# Patient Record
Sex: Male | Born: 1970 | Race: Black or African American | Hispanic: No | Marital: Single | Smoking: Current every day smoker
Health system: Southern US, Community
[De-identification: ages and names within clinical notes are randomized; demographics above are authoritative.]

## PROBLEM LIST (undated history)

## (undated) HISTORY — PX: HAND SURGERY: SHX662

## (undated) HISTORY — PX: JOINT REPLACEMENT: SHX530

---

## 2020-03-27 ENCOUNTER — Emergency Department (HOSPITAL_COMMUNITY)
Admission: EM | Admit: 2020-03-27 | Discharge: 2020-03-27 | Disposition: A | Discharge status: Home / Self Care | Payer: Self-pay | Attending: Emergency Medicine | Admitting: Emergency Medicine

## 2020-03-27 ENCOUNTER — Emergency Department (HOSPITAL_COMMUNITY): Payer: Self-pay

## 2020-03-27 ENCOUNTER — Other Ambulatory Visit: Payer: Self-pay

## 2020-03-27 ENCOUNTER — Encounter (HOSPITAL_COMMUNITY): Payer: Self-pay | Admitting: Emergency Medicine

## 2020-03-27 DIAGNOSIS — M25562 Pain in left knee: Secondary | ICD-10-CM | POA: Insufficient documentation

## 2020-03-27 DIAGNOSIS — F17210 Nicotine dependence, cigarettes, uncomplicated: Secondary | ICD-10-CM | POA: Insufficient documentation

## 2020-03-27 LAB — BASIC METABOLIC PANEL
Anion gap: 11 (ref 5–15)
BUN: 6 mg/dL (ref 6–20)
CO2: 24 mmol/L (ref 22–32)
Calcium: 8.4 mg/dL — ABNORMAL LOW (ref 8.9–10.3)
Chloride: 104 mmol/L (ref 98–111)
Creatinine, Ser: 0.75 mg/dL (ref 0.61–1.24)
GFR calc Af Amer: >60 mL/min (ref >60)
GFR calc non Af Amer: >60 mL/min (ref >60)
Glucose, Bld: 90 mg/dL (ref 70–99)
Potassium: 3.7 mmol/L (ref 3.5–5.1)
Sodium: 139 mmol/L (ref 135–145)

## 2020-03-27 LAB — CBC WITH DIFFERENTIAL/PLATELET
Abs Immature Granulocytes: 0.01 10*3/uL (ref 0.00–0.07)
Basophils Absolute: 0 10*3/uL (ref 0.0–0.1)
Basophils Relative: 0 %
Eosinophils Absolute: 0.2 10*3/uL (ref 0.0–0.5)
Eosinophils Relative: 6 %
HCT: 44.3 % (ref 39.0–52.0)
Hemoglobin: 14.7 g/dL (ref 13.0–17.0)
Immature Granulocytes: 0 %
Lymphocytes Relative: 48 %
Lymphs Abs: 1.6 10*3/uL (ref 0.7–4.0)
MCH: 32.1 pg (ref 26.0–34.0)
MCHC: 33.2 g/dL (ref 30.0–36.0)
MCV: 96.7 fL (ref 80.0–100.0)
Monocytes Absolute: 0.4 10*3/uL (ref 0.1–1.0)
Monocytes Relative: 12 %
Neutro Abs: 1.2 10*3/uL — ABNORMAL LOW (ref 1.7–7.7)
Neutrophils Relative %: 34 %
Platelets: 135 10*3/uL — ABNORMAL LOW (ref 150–400)
RBC: 4.58 MIL/uL (ref 4.22–5.81)
RDW: 15.1 % (ref 11.5–15.5)
WBC: 3.5 10*3/uL — ABNORMAL LOW (ref 4.0–10.5)
nRBC: 0 % (ref 0.0–0.2)

## 2020-03-27 LAB — SEDIMENTATION RATE: Sed Rate: 8 mm/hr (ref 0–16)

## 2020-03-27 LAB — C-REACTIVE PROTEIN: CRP: 0.6 mg/dL (ref <1.0)

## 2020-03-27 MED ORDER — NAPROXEN 500 MG PO TABS
500.0000 mg | ORAL_TABLET | Freq: Two times a day (BID) | ORAL | 0 refills | Status: DC | PRN
Start: 2020-03-27 — End: 2021-10-18

## 2020-03-27 MED ORDER — METHOCARBAMOL 500 MG PO TABS
500.0000 mg | ORAL_TABLET | Freq: Three times a day (TID) | ORAL | 0 refills | Status: DC | PRN
Start: 2020-03-27 — End: 2021-10-18

## 2020-03-27 MED ORDER — HYDROCODONE-ACETAMINOPHEN 5-325 MG PO TABS
1.0000 | ORAL_TABLET | Freq: Once | ORAL | Status: AC
  Administered 2020-03-27: 1 via ORAL
  Filled 2020-03-27: qty 1

## 2020-03-27 MED ORDER — KETOROLAC TROMETHAMINE 60 MG/2ML IM SOLN
30.0000 mg | Freq: Once | INTRAMUSCULAR | Status: AC
  Administered 2020-03-27: 30 mg via INTRAMUSCULAR
  Filled 2020-03-27: qty 2

## 2020-03-27 MED ORDER — OXYCODONE-ACETAMINOPHEN 5-325 MG PO TABS
1.0000 | ORAL_TABLET | Freq: Once | ORAL | Status: AC
  Administered 2020-03-27: 1 via ORAL
  Filled 2020-03-27: qty 1

## 2020-03-27 NOTE — ED Provider Notes (Signed)
[facility]-EMERGENCY DEPT Provider Note   CSN: [id number] Arrival date & time: 03/27/20  6237     History Chief Complaint  Patient presents with  . Leg Pain    Jay Gross is a 49 y.o. male with a history of multiple left knee surgical procedures who presents to the emergency department with complaints of left knee pain that began when he got out of bed this evening.  Patient states that he stood up and felt that his left knee gave out causing him to fall.  He denies head injury or loss of consciousness.  He states he had discomfort since then that is worse with movement.  He has baseline swelling in the left knee that is mildly worsened since the knee giving out.  He denies any redness, fever, chills, numbness, tingling, or weakness.  His prior surgeries have been in [place].  HPI     History reviewed. No pertinent past medical history.  There are no problems to display for this patient.   History reviewed. No pertinent surgical history.     History reviewed. No pertinent family history.  Social History   Tobacco Use  . Smoking status: Current Every Day Smoker  . Smokeless tobacco: Never Used  Vaping Use  . Vaping Use: Never used  Substance Use Topics  . Alcohol use: Never  . Drug use: Never    Home Medications Prior to Admission medications   Not on File    Allergies    Patient has no known allergies.  Review of Systems   Review of Systems  Constitutional: Negative for chills and fever.  Respiratory: Negative for shortness of breath.   Cardiovascular: Negative for chest pain.  Gastrointestinal: Negative for abdominal pain.  Musculoskeletal: Positive for arthralgias and joint swelling.  Skin: Negative for color change, rash and wound.  Neurological: Negative for weakness and numbness.  All other systems reviewed and are negative.  Physical Exam Updated Vital Signs BP (!) 117/96   Pulse 87   Temp 98.2 F (36.8 C) (Oral)    Resp 18   Ht 6' (1.829 m)   Wt 81.6 kg   SpO2 98%   BMI 24.41 kg/m   Physical Exam Vitals and nursing note reviewed.  Constitutional:      General: He is not in acute distress.    Appearance: He is well-developed. He is not ill-appearing or toxic-appearing.  HENT:     Head: Normocephalic and atraumatic.  Eyes:     General:        Right eye: No discharge.        Left eye: No discharge.     Conjunctiva/sclera: Conjunctivae normal.  Cardiovascular:     Rate and Rhythm: Normal rate and regular rhythm.     Pulses:          Dorsalis pedis pulses are 2+ on the right side and 2+ on the left side.       Posterior tibial pulses are 2+ on the right side and 2+ on the left side.  Pulmonary:     Effort: Pulmonary effort is normal. No respiratory distress.     Breath sounds: Normal breath sounds. No wheezing, rhonchi or rales.  Abdominal:     General: There is no distension.     Palpations: Abdomen is soft.     Tenderness: There is no abdominal tenderness.  Musculoskeletal:     Cervical back: Neck supple.     Comments: Lower extremities: Patient has  previous surgical scars healed to the left lower extremity.  The left knee is swollen compared to the right knee with a questionable joint effusion, patient states that his left knee is more swollen than the right at baseline but it is mildly worse currently.  There is no erythema or warmth.  There are no significant open wounds.  Patient has intact active range of motion throughout the entire right lower extremity as well as all joints in the left lower extremity with the exception of the left knee.  He is able to almost fully extend the left knee, able to flex to about 90 degrees.  No significant pain with passive range of motion.  Patient is tender diffusely throughout the left knee.  Lower extremities are otherwise nontender.  Compartments are soft.  Skin:    General: Skin is warm and dry.     Capillary Refill: Capillary refill takes less than 2  seconds.     Findings: No rash.  Neurological:     Mental Status: He is alert.     Comments: Alert. Clear speech. Sensation grossly intact to bilateral lower extremities. 5/5 strength with knee flexion/extension and ankle plantar/dorsiflexion bilaterally.    Psychiatric:        Mood and Affect: Mood normal.        Behavior: Behavior normal.     ED Results / Procedures / Treatments   Labs (all labs ordered are listed, but only abnormal results are displayed) Labs Reviewed  CBC WITH DIFFERENTIAL/PLATELET - Abnormal; Notable for the following components:      Result Value   WBC 3.5 (*)    Platelets 135 (*)    Neutro Abs 1.2 (*)    All other components within normal limits  BASIC METABOLIC PANEL - Abnormal; Notable for the following components:   Calcium 8.4 (*)    All other components within normal limits  SEDIMENTATION RATE  C-REACTIVE PROTEIN    EKG None  Radiology DG Knee Complete 4 Views Left  Result Date: 03/27/2020 CLINICAL DATA:  Left leg pain, unable to bear weight or extend EXAM: LEFT KNEE - COMPLETE 4+ VIEW COMPARISON:  None. 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 Alignment is grossly preserved though in a moderately flexed knee position. Heterogeneous changes about the proximal tibia with some peripheral lucency along the lateral tibial and medial femoral condylar components. Possibly related to patient's history of prior revision however, cannot exclude hardware infection or septic arthritis particularly given some more circumferential edematous features and a small suspected effusion. No soft tissue gas or foreign body. IMPRESSION: 1. 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 2. Heterogeneous appearance of the proximal tibia with some peripheral lucency along the lateral tibial plateau and medial femoral condylar component. Feasibly related to prior revision however cannot exclude hardware infection or septic arthritis given  circumferential swelling and effusion. Correlate with clinical symptoms and history. 3. No definite acute fracture or traumatic osseous injury. Though evaluation is limited due to sub standard views. 4. Small effusion. Electronically Signed   By: Lovena Le M.D.   On: 03/27/2020 02:16    Procedures Procedures (including critical care time)  Medications Ordered in ED Medications  HYDROcodone-acetaminophen (NORCO/VICODIN) 5-325 MG per tablet 1 tablet (1 tablet Oral Given 03/27/20 0212)    ED Course  I have reviewed the triage vital signs and the nursing notes.  Pertinent labs & imaging results that were available during my care of the patient were reviewed by me and  considered in my medical decision making (see chart for details).    MDM Rules/Calculators/A&P                         Patient presents to the ED with complaints of left knee pain after it giving out when he tried to get out of bed. Nontoxic, vitals WNL On exam swelling and likely small effusion present, mild limitation in ROM, diffusely tender. No signs of infection.   Imaging Studies ordered:  I ordered imaging studies which included left knee x-ray, I independently visualized and interpreted imaging which showed 1. 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 2. Heterogeneous appearance of the proximal tibia with some peripheral lucency along the lateral tibial plateau and medial femoral condylar component. Feasibly related to prior revision however cannot exclude hardware infection or septic arthritis given circumferential swelling and effusion. Correlate with clinical symptoms and history. 3. No definite acute fracture or traumatic osseous injury. Though evaluation is limited due to sub standard views. 4. Small effusion.   Lab Tests:  I Ordered, reviewed, and interpreted labs, which included:  GBT:DVVO leukopenia, no leukocytosis.  BMP: mild hypocalcemia no significant electrolyte derangement.  Sed rate & CRP:  WNL  ED Course:  Given x-ray with likely postsurgical changes however could not exclude hardware infection or septic arthritis per radiologist interpretation labs were obtained, patient does not have a leukocytosis or elevated inflammatory markers, he is afebrile, there is no overlying erythema or warmth to the joint, therefore do not suspect septic joint at this time.  He is able to actively flex/extend the knee therefore do not suspect quadriceps/patellar tendon injury.  No calf tenderness or edema to raise concern for DVT.  Good symmetric pulses with symmetric temperature therefore do not suspect arterial emboli.  X-ray without fracture or dislocation.  Will place patient into brace, provide crutches, recommended PRICE & will treat with NSAIDs & muscle relxant with orthopedics follow up. I discussed results, treatment plan, need for follow-up, and return precautions with the patient. Provided opportunity for questions, patient confirmed understanding and is in agreement with plan.   Findings and plan of care discussed with supervising physician Dr. Elesa Massed who is in agreement.    Analgesics given in the emergency department with brace applied and crutches provided. Portions of this note were generated with Scientist, clinical (histocompatibility and immunogenetics). Dictation errors may occur despite best attempts at proofreading.  Final Clinical Impression(s) / ED Diagnoses Final diagnoses:  Acute pain of left knee    Rx / DC Orders ED Discharge Orders         Ordered    naproxen (NAPROSYN) 500 MG tablet  2 times daily PRN     Discontinue  Reprint     03/27/20 0503    methocarbamol (ROBAXIN) 500 MG tablet  Every 8 hours PRN     Discontinue  Reprint     03/27/20 0503           Kinesha Auten, Pleas Koch, PA-C 03/27/20 0506    Ward, Layla Maw, DO 03/27/20 1607

## 2020-03-27 NOTE — Discharge Instructions (Addendum)
Please read and follow all provided instructions.  You have been seen today for left knee pain.   Tests performed today include: An x-ray of the affected area - does NOT show any broken bones or dislocations.  Vital signs. See below for your results today.   Home care instructions: -- *PRICE in the first 24-48 hours after injury: Protect (with brace, splint, sling), if given by your provider Rest Ice- Do not apply ice pack directly to your skin, place towel or similar between your skin and ice/ice pack. Apply ice for 20 min, then remove for 40 min while awake Compression- Wear brace, elastic bandage, splint as directed by your provider Elevate affected extremity above the level of your heart when not walking around for the first 24-48 hours   Medications:  - Naproxen is a nonsteroidal anti-inflammatory medication that will help with pain and swelling. Be sure to take this medication as prescribed with food, 1 pill every 12 hours,  It should be taken with food, as it can cause stomach upset, and more seriously, stomach bleeding. Do not take other nonsteroidal anti-inflammatory medications with this such as Advil, Motrin, Aleve, Mobic, Goodie Powder, or Motrin.    - Robaxin is the muscle relaxer I have prescribed, this is meant to help with muscle tightness. Be aware that this medication may make you drowsy therefore the first time you take this it should be at a time you are in an environment where you can rest. Do not drive or operate heavy machinery when taking this medication. Do not drink alcohol or take other sedating medications with this medicine such as narcotics or benzodiazepines.   You make take Tylenol per over the counter dosing with these medications.   We have prescribed you new medication(s) today. Discuss the medications prescribed today with your pharmacist as they can have adverse effects and interactions with your other medicines including over the counter and prescribed  medications. Seek medical evaluation if you start to experience new or abnormal symptoms after taking one of these medicines, seek care immediately if you start to experience difficulty breathing, feeling of your throat closing, facial swelling, or rash as these could be indications of a more serious allergic reaction   Follow-up instructions: Please follow-up with your primary care provider or the provided orthopedic physician (bone specialist) if you continue to have significant pain in 1 week. In this case you may have a more severe injury that requires further care.   Return instructions:  Please return if your digits or extremity are numb or tingling, appear gray or blue, or you have severe pain (also elevate the extremity and loosen splint or wrap if you were given one) Please return if you have redness or fevers.  Please return to the Emergency Department if you experience worsening symptoms.  Please return if you have any other emergent concerns. Additional Information:  Your vital signs today were: BP 111/85   Pulse 74   Temp 98.2 F (36.8 C) (Oral)   Resp 16   Ht 6' (1.829 m)   Wt 81.6 kg   SpO2 97%   BMI 24.41 kg/m  If your blood pressure (BP) was elevated above 135/85 this visit, please have this repeated by your doctor within one month. ---------------

## 2020-03-27 NOTE — ED Triage Notes (Signed)
Patient is complaining of left leg pain when he put pressure on it. Patient states that he can not move it. Patient also states every 10 yrs he get a knee replacement.

## 2021-02-02 ENCOUNTER — Other Ambulatory Visit: Payer: Self-pay

## 2021-02-02 ENCOUNTER — Emergency Department (HOSPITAL_COMMUNITY)
Admission: EM | Admit: 2021-02-02 | Discharge: 2021-02-02 | Disposition: A | Discharge status: Home / Self Care | Payer: Medicaid - Out of State | Attending: Emergency Medicine | Admitting: Emergency Medicine

## 2021-02-02 ENCOUNTER — Emergency Department (HOSPITAL_COMMUNITY): Payer: Medicaid - Out of State

## 2021-02-02 ENCOUNTER — Encounter (HOSPITAL_COMMUNITY): Payer: Self-pay

## 2021-02-02 DIAGNOSIS — R1013 Epigastric pain: Secondary | ICD-10-CM | POA: Insufficient documentation

## 2021-02-02 DIAGNOSIS — R1111 Vomiting without nausea: Secondary | ICD-10-CM

## 2021-02-02 DIAGNOSIS — F17210 Nicotine dependence, cigarettes, uncomplicated: Secondary | ICD-10-CM | POA: Diagnosis not present

## 2021-02-02 DIAGNOSIS — R112 Nausea with vomiting, unspecified: Secondary | ICD-10-CM | POA: Insufficient documentation

## 2021-02-02 DIAGNOSIS — R079 Chest pain, unspecified: Secondary | ICD-10-CM | POA: Diagnosis not present

## 2021-02-02 LAB — CBC WITH DIFFERENTIAL/PLATELET
Abs Immature Granulocytes: 0.01 10*3/uL (ref 0.00–0.07)
Basophils Absolute: 0 10*3/uL (ref 0.0–0.1)
Basophils Relative: 0 %
Eosinophils Absolute: 0 10*3/uL (ref 0.0–0.5)
Eosinophils Relative: 0 %
HCT: 47.5 % (ref 39.0–52.0)
Hemoglobin: 15.7 g/dL (ref 13.0–17.0)
Immature Granulocytes: 0 %
Lymphocytes Relative: 20 %
Lymphs Abs: 1 10*3/uL (ref 0.7–4.0)
MCH: 32.3 pg (ref 26.0–34.0)
MCHC: 33.1 g/dL (ref 30.0–36.0)
MCV: 97.7 fL (ref 80.0–100.0)
Monocytes Absolute: 0.4 10*3/uL (ref 0.1–1.0)
Monocytes Relative: 9 %
Neutro Abs: 3.4 10*3/uL (ref 1.7–7.7)
Neutrophils Relative %: 71 %
Platelets: 161 10*3/uL (ref 150–400)
RBC: 4.86 MIL/uL (ref 4.22–5.81)
RDW: 15.5 % (ref 11.5–15.5)
WBC: 4.9 10*3/uL (ref 4.0–10.5)
nRBC: 0 % (ref 0.0–0.2)

## 2021-02-02 LAB — COMPREHENSIVE METABOLIC PANEL
ALT: 53 U/L — ABNORMAL HIGH (ref 0–44)
AST: 80 U/L — ABNORMAL HIGH (ref 15–41)
Albumin: 4 g/dL (ref 3.5–5.0)
Alkaline Phosphatase: 87 U/L (ref 38–126)
Anion gap: 14 (ref 5–15)
BUN: 7 mg/dL (ref 6–20)
CO2: 23 mmol/L (ref 22–32)
Calcium: 9 mg/dL (ref 8.9–10.3)
Chloride: 103 mmol/L (ref 98–111)
Creatinine, Ser: 0.77 mg/dL (ref 0.61–1.24)
GFR, Estimated: >60 mL/min (ref >60)
Glucose, Bld: 65 mg/dL — ABNORMAL LOW (ref 70–99)
Potassium: 4.1 mmol/L (ref 3.5–5.1)
Sodium: 140 mmol/L (ref 135–145)
Total Bilirubin: 0.6 mg/dL (ref 0.3–1.2)
Total Protein: 8.2 g/dL — ABNORMAL HIGH (ref 6.5–8.1)

## 2021-02-02 LAB — URINALYSIS, ROUTINE W REFLEX MICROSCOPIC
Bacteria, UA: NONE SEEN
Bilirubin Urine: NEGATIVE
Glucose, UA: NEGATIVE mg/dL
Ketones, ur: NEGATIVE mg/dL
Leukocytes,Ua: NEGATIVE
Nitrite: NEGATIVE
Protein, ur: NEGATIVE mg/dL
Specific Gravity, Urine: 1.003 — ABNORMAL LOW (ref 1.005–1.030)
pH: 6 (ref 5.0–8.0)

## 2021-02-02 LAB — LIPASE, BLOOD: Lipase: 44 U/L (ref 11–51)

## 2021-02-02 LAB — TROPONIN I (HIGH SENSITIVITY): Troponin I (High Sensitivity): 4 ng/L (ref <18)

## 2021-02-02 MED ORDER — ONDANSETRON HCL 4 MG/2ML IJ SOLN
4.0000 mg | Freq: Once | INTRAMUSCULAR | Status: AC
  Administered 2021-02-02: 4 mg via INTRAVENOUS
  Filled 2021-02-02: qty 2

## 2021-02-02 MED ORDER — PANTOPRAZOLE SODIUM 20 MG PO TBEC: 20.0000 mg | DELAYED_RELEASE_TABLET | Freq: Every day | ORAL | 0 refills | Status: DC

## 2021-02-02 MED ORDER — ONDANSETRON HCL 4 MG PO TABS: 4.0000 mg | ORAL_TABLET | Freq: Three times a day (TID) | ORAL | 0 refills | Status: DC | PRN

## 2021-02-02 MED ORDER — SODIUM CHLORIDE 0.9 % IV BOLUS
1000.0000 mL | Freq: Once | INTRAVENOUS | Status: AC
  Administered 2021-02-02: 1000 mL via INTRAVENOUS

## 2021-02-02 MED ORDER — ASPIRIN 81 MG PO CHEW
324.0000 mg | CHEWABLE_TABLET | Freq: Once | ORAL | Status: DC
  Filled 2021-02-02: qty 4

## 2021-02-02 MED ORDER — SUCRALFATE 1 G PO TABS: 1.0000 g | ORAL_TABLET | Freq: Three times a day (TID) | ORAL | 0 refills | Status: DC

## 2021-02-02 MED ORDER — ONDANSETRON 4 MG PO TBDP
4.0000 mg | ORAL_TABLET | Freq: Once | ORAL | Status: DC
  Filled 2021-02-02: qty 1

## 2021-02-02 MED ORDER — FENTANYL CITRATE (PF) 100 MCG/2ML IJ SOLN
50.0000 ug | Freq: Once | INTRAMUSCULAR | Status: AC
Start: 2021-02-02 — End: 2021-02-02
  Administered 2021-02-02: 50 ug via INTRAVENOUS
  Filled 2021-02-02: qty 2

## 2021-02-02 NOTE — ED Notes (Signed)
Walked into room patient had already taken his leads and blood pressure cuff, stated he was ready to go. Patient given DC instructions.

## 2021-02-02 NOTE — ED Triage Notes (Signed)
Patient states he had chest pain and emesis 4 days ago. Patient states it started again today at 0400 this AM and states he has vomited approx 12 times today.

## 2021-02-02 NOTE — ED Provider Notes (Signed)
[facility]-EMERGENCY DEPT Provider Note   CSN: [id number] Arrival date & time: 02/02/21  1455     History Chief Complaint  Patient presents with  . Chest Pain  . Emesis    Jay Gross is a 50 y.o. male.   Chest Pain Pain location:  Epigastric Pain quality: aching   Pain radiates to:  Does not radiate Pain severity:  Mild Onset quality:  Gradual Timing:  Constant Progression:  Unchanged Chronicity:  New Context: eating and at rest   Relieved by:  Nothing Worsened by:  Nothing Associated symptoms: nausea and vomiting   Associated symptoms: no abdominal pain, no back pain, no cough, no fever, no palpitations and no shortness of breath   Risk factors: no coronary artery disease, no high cholesterol, no hypertension and no smoking   Risk factors comment:  Etoh use Emesis Associated symptoms: no abdominal pain, no arthralgias, no chills, no cough, no fever and no sore throat        History reviewed. No pertinent past medical history.  There are no problems to display for this patient.   Past Surgical History:  Procedure Laterality Date  . HAND SURGERY    . JOINT REPLACEMENT         Family History  Problem Relation Age of Onset  . Hypertension Mother     Social History   Tobacco Use  . Smoking status: Current Every Day Smoker    Packs/day: 0.50    Types: Cigarettes  . Smokeless tobacco: Never Used  Vaping Use  . Vaping Use: Never used  Substance Use Topics  . Alcohol use: Never  . Drug use: Never    Home Medications Prior to Admission medications   Medication Sig Start Date End Date Taking? Authorizing Provider  ondansetron (ZOFRAN) 4 MG tablet Take 1 tablet (4 mg total) by mouth every 8 (eight) hours as needed for up to 20 doses for nausea or vomiting. 02/02/21  Yes Mahamed Zalewski, DO  pantoprazole (PROTONIX) 20 MG tablet Take 1 tablet (20 mg total) by mouth daily for 14 days. 02/02/21 02/16/21 Yes Ahri Olson, DO  sucralfate  (CARAFATE) 1 g tablet Take 1 tablet (1 g total) by mouth 4 (four) times daily -  with meals and at bedtime for 14 days. 02/02/21 02/16/21 Yes Jacy Howat, DO  methocarbamol (ROBAXIN) 500 MG tablet Take 1 tablet (500 mg total) by mouth every 8 (eight) hours as needed for muscle spasms. 03/27/20   Petrucelli, Samantha R, PA-C  naproxen (NAPROSYN) 500 MG tablet Take 1 tablet (500 mg total) by mouth 2 (two) times daily as needed for moderate pain. 03/27/20   Petrucelli, Pleas Koch, PA-C    Allergies    Other  Review of Systems   Review of Systems  Constitutional: Negative for chills and fever.  HENT: Negative for ear pain and sore throat.   Eyes: Negative for pain and visual disturbance.  Respiratory: Negative for cough and shortness of breath.   Cardiovascular: Positive for chest pain. Negative for palpitations.  Gastrointestinal: Positive for nausea and vomiting. Negative for abdominal pain.  Genitourinary: Negative for dysuria and hematuria.  Musculoskeletal: Negative for arthralgias and back pain.  Skin: Negative for color change and rash.  Neurological: Negative for seizures and syncope.  All other systems reviewed and are negative.   Physical Exam Updated Vital Signs  ED Triage Vitals  Enc Vitals Group     BP 02/02/21 1507 (!) 143/100     Pulse Rate  02/02/21 1507 (!) 109     Resp 02/02/21 1507 (!) 22     Temp 02/02/21 1507 98.8 F (37.1 C)     Temp Source 02/02/21 1507 Oral     SpO2 02/02/21 1507 98 %     Weight 02/02/21 1513 187 lb 9.6 oz (85.1 kg)     Height 02/02/21 1513 6' (1.829 m)     Head Circumference --      Peak Flow --      Pain Score 02/02/21 1513 8     Pain Loc --      Pain Edu? --      Excl. in GC? --     Physical Exam Vitals and nursing note reviewed.  Constitutional:      General: He is not in acute distress.    Appearance: He is well-developed. He is not ill-appearing.  HENT:     Head: Normocephalic and atraumatic.  Eyes:     Conjunctiva/sclera:  Conjunctivae normal.  Cardiovascular:     Rate and Rhythm: Normal rate and regular rhythm.     Pulses:          Radial pulses are 2+ on the right side and 2+ on the left side.     Heart sounds: Normal heart sounds. No murmur heard.   Pulmonary:     Effort: Pulmonary effort is normal. No respiratory distress.     Breath sounds: Normal breath sounds.  Abdominal:     Palpations: Abdomen is soft.     Tenderness: There is abdominal tenderness (epigatric).  Musculoskeletal:        General: Normal range of motion.     Cervical back: Neck supple.     Right lower leg: No edema.     Left lower leg: No edema.  Skin:    General: Skin is warm and dry.     Capillary Refill: Capillary refill takes less than 2 seconds.  Neurological:     General: No focal deficit present.     Mental Status: He is alert.     ED Results / Procedures / Treatments   Labs (all labs ordered are listed, but only abnormal results are displayed) Labs Reviewed  COMPREHENSIVE METABOLIC PANEL - Abnormal; Notable for the following components:      Result Value   Glucose, Bld 65 (*)    Total Protein 8.2 (*)    AST 80 (*)    ALT 53 (*)    All other components within normal limits  URINALYSIS, ROUTINE W REFLEX MICROSCOPIC - Abnormal; Notable for the following components:   Color, Urine STRAW (*)    Specific Gravity, Urine 1.003 (*)    Hgb urine dipstick SMALL (*)    All other components within normal limits  CBC WITH DIFFERENTIAL/PLATELET  LIPASE, BLOOD  TROPONIN I (HIGH SENSITIVITY)    EKG EKG Interpretation  Date/Time:  Thursday Feb 02 2021 16:57:29 EDT Ventricular Rate:  85 PR Interval:  162 QRS Duration: 101 QT Interval:  388 QTC Calculation: 462 R Axis:   122 Text Interpretation: Sinus rhythm Left posterior fascicular block Confirmed by Virgina Norfolk ([id number]) on 02/02/2021 5:13:17 PM   Radiology DG Chest 2 View  Result Date: 02/02/2021 CLINICAL DATA:  Chest pain and shortness of breath. EXAM: CHEST -  2 VIEW COMPARISON:  None. FINDINGS: Normal sized heart.  Clear lungs.  Unremarkable bones IMPRESSION: No active cardiopulmonary disease. Electronically Signed   By: Beckie Salts M.D.   On: 02/02/2021 15:50  Procedures Procedures   Medications Ordered in ED Medications  sodium chloride 0.9 % bolus 1,000 mL (0 mLs Intravenous Stopped 02/02/21 1816)  ondansetron (ZOFRAN) injection 4 mg (4 mg Intravenous Given 02/02/21 1724)  fentaNYL (SUBLIMAZE) injection 50 mcg (50 mcg Intravenous Given 02/02/21 1817)    ED Course  I have reviewed the triage vital signs and the nursing notes.  Pertinent labs & imaging results that were available during my care of the patient were reviewed by me and considered in my medical decision making (see chart for details).    MDM Rules/Calculators/A&P                          Jay Gross is a 50 year old male with no significant medical history presents the ED with chest pain, abdominal pain.  Pain mostly in the epigastric region.  Has had nausea and vomiting.  History of alcohol use.  Denies any melena.  No shortness of breath.  Tender in the epigastric region.  Overall story more typical of gastritis or pancreatitis or cholecystitis.  Less likely ACS.  EKG shows sinus rhythm.  Will get troponin, lipase.  Will give IV fluids, IV Zofran, IV fentanyl.  Patient with normal troponin.  Normal electrolytes.  No significant anemia.  Blood sugar 65 the patient was able to eat and drink without any issues.  Feeling much better after Zofran.  Overall suspect gastritis.  No pancreatitis.  Will give medicine for reflux.  Discharged in good condition.  Understands return precautions.  No concern for ACS or PE.  This chart was dictated using voice recognition software.  Despite best efforts to proofread,  errors can occur which can change the documentation meaning.   Final Clinical Impression(s) / ED Diagnoses Final diagnoses:  Vomiting without nausea, intractability of vomiting  not specified, unspecified vomiting type    Rx / DC Orders ED Discharge Orders         Ordered    ondansetron (ZOFRAN) 4 MG tablet  Every 8 hours PRN        02/02/21 2012    sucralfate (CARAFATE) 1 g tablet  3 times daily with meals & bedtime        02/02/21 2015    pantoprazole (PROTONIX) 20 MG tablet  Daily        02/02/21 2015           Virgina Norfolk, DO 02/02/21 2017

## 2021-02-02 NOTE — ED Notes (Signed)
Assumed care of patient.

## 2021-02-02 NOTE — ED Provider Notes (Signed)
Emergency Medicine Provider Triage Evaluation Note  Jay Gross , a 50 y.o. male  was evaluated in triage.  Pt complains of mid sternal chest pain.  Pain began at 0400.  Constant since than.  Pain does not radiate.  Endorses associated nausea and vomiting.,  Vomited 12x in last 24hrs.  Describes emesis as bilious and non bloody.  Has not been able to keep any po intake down.    Review of Systems  Positive: Nausea, vomiting, midsternal chest pain, chills, diarrhea Negative: Fevers, abdominal pain, myalgia, blood in stool, melena, dysuria, hematuria, GU complaints  Physical Exam  BP (!) 143/100 (BP Location: Right Arm)   Pulse (!) 109   Temp 98.8 F (37.1 C) (Oral)   Resp (!) 22   Ht 6' (1.829 m)   Wt 85.1 kg   SpO2 98%   BMI 25.44 kg/m  Gen:   Awake, no distress   Resp:  Normal effort, lungs clear to auscultation  MSK:   Moves extremities without difficulty  Other:    Medical Decision Making  Medically screening exam initiated at 3:50 PM.  Appropriate orders placed.  Jay Gross was informed that the remainder of the evaluation will be completed by another provider, this initial triage assessment does not replace that evaluation, and the importance of remaining in the ED until their evaluation is complete.  The patient appears stable so that the remainder of the work up may be completed by another provider.     [name], PA-C 02/02/21 1557    Charlynne Pander, MD 02/04/21 1536

## 2021-05-17 ENCOUNTER — Encounter (HOSPITAL_COMMUNITY): Payer: Self-pay | Admitting: Emergency Medicine

## 2021-05-17 ENCOUNTER — Other Ambulatory Visit: Payer: Self-pay

## 2021-05-17 ENCOUNTER — Ambulatory Visit (HOSPITAL_COMMUNITY)
Admission: EM | Admit: 2021-05-17 | Discharge: 2021-05-17 | Disposition: A | Discharge status: Home / Self Care | Payer: Medicaid - Out of State | Attending: Physician Assistant | Admitting: Physician Assistant

## 2021-05-17 DIAGNOSIS — R8290 Unspecified abnormal findings in urine: Secondary | ICD-10-CM

## 2021-05-17 DIAGNOSIS — R310 Gross hematuria: Secondary | ICD-10-CM

## 2021-05-17 DIAGNOSIS — R369 Urethral discharge, unspecified: Secondary | ICD-10-CM

## 2021-05-17 LAB — POCT URINALYSIS DIPSTICK, ED / UC
Bilirubin Urine: NEGATIVE
Glucose, UA: NEGATIVE mg/dL
Nitrite: NEGATIVE
Protein, ur: 30 mg/dL — AB
Specific Gravity, Urine: 1.015 (ref 1.005–1.030)
Urobilinogen, UA: 0.2 mg/dL (ref 0.0–1.0)
pH: 5 (ref 5.0–8.0)

## 2021-05-17 MED ORDER — CEFTRIAXONE SODIUM 1 G IJ SOLR
1.0000 g | Freq: Once | INTRAMUSCULAR | Status: AC
  Administered 2021-05-17: 1 g via INTRAMUSCULAR

## 2021-05-17 MED ORDER — SULFAMETHOXAZOLE-TRIMETHOPRIM 800-160 MG PO TABS: 1.0000 | ORAL_TABLET | Freq: Two times a day (BID) | ORAL | 0 refills | Status: AC

## 2021-05-17 MED ORDER — CEFTRIAXONE SODIUM 1 G IJ SOLR
INTRAMUSCULAR | Status: AC
  Filled 2021-05-17: qty 10

## 2021-05-17 MED ORDER — LIDOCAINE HCL (PF) 1 % IJ SOLN
INTRAMUSCULAR | Status: AC
  Filled 2021-05-17: qty 4

## 2021-05-17 NOTE — ED Triage Notes (Signed)
Blood in urine for 5 days.  Patient reports having pain in penis.

## 2021-05-17 NOTE — ED Provider Notes (Signed)
[facility]    CSN: [id number] Arrival date & time: 05/17/21  8657      History   Chief Complaint Chief Complaint  Patient presents with   Hematuria    HPI Jay Gross is a 50 y.o. male.   Patient presents today with a 5-day history of hematuria.  He describes this as gross blood in his urine.  He also reports penile discharge as well as some lower abdominal pain.  He denies any frequency, urgency, nausea/vomiting increased from baseline, fever.  He does report some nausea symptoms but states this is chronic and unchanged from baseline.  He has not tried any over-the-counter medication for symptom management.  Denies history of nephrolithiasis or recurrent UTI.  He does not believe he has an STI but is open to testing.  He denies any recent antibiotic use.  He is a smoker but denies any heavy metal exposure.  Denies any family history of kidney cancer.   History reviewed. No pertinent past medical history.  There are no problems to display for this patient.   Past Surgical History:  Procedure Laterality Date   HAND SURGERY     JOINT REPLACEMENT         Home Medications    Prior to Admission medications   Medication Sig Start Date End Date Taking? Authorizing Provider  sulfamethoxazole-trimethoprim (BACTRIM DS) 800-160 MG tablet Take 1 tablet by mouth 2 (two) times daily for 7 days. 05/17/21 05/24/21 Yes Lue [place], Noberto Retort, PA-C  methocarbamol (ROBAXIN) 500 MG tablet Take 1 tablet (500 mg total) by mouth every 8 (eight) hours as needed for muscle spasms. Patient not taking: Reported on 05/17/2021 03/27/20   Petrucelli, Lelon Mast R, PA-C  naproxen (NAPROSYN) 500 MG tablet Take 1 tablet (500 mg total) by mouth 2 (two) times daily as needed for moderate pain. Patient not taking: Reported on 05/17/2021 03/27/20   Petrucelli, Lelon Mast R, PA-C  ondansetron (ZOFRAN) 4 MG tablet Take 1 tablet (4 mg total) by mouth every 8 (eight) hours as needed for up to 20 doses for nausea or  vomiting. Patient not taking: Reported on 05/17/2021 02/02/21   Virgina Norfolk, DO  pantoprazole (PROTONIX) 20 MG tablet Take 1 tablet (20 mg total) by mouth daily for 14 days. Patient not taking: Reported on 05/17/2021 02/02/21 02/16/21  Virgina Norfolk, DO  sucralfate (CARAFATE) 1 g tablet Take 1 tablet (1 g total) by mouth 4 (four) times daily -  with meals and at bedtime for 14 days. Patient not taking: Reported on 05/17/2021 02/02/21 02/16/21  Virgina Norfolk, DO    Family History Family History  Problem Relation Age of Onset   Hypertension Mother     Social History Social History   Tobacco Use   Smoking status: Every Day    Packs/day: 0.50    Types: Cigarettes   Smokeless tobacco: Never  Vaping Use   Vaping Use: Never used  Substance Use Topics   Alcohol use: Yes   Drug use: Never     Allergies   Other   Review of Systems Review of Systems  Constitutional:  Negative for activity change, appetite change, fatigue and fever.  Respiratory:  Negative for cough and shortness of breath.   Cardiovascular:  Negative for chest pain.  Gastrointestinal:  Positive for abdominal pain. Negative for diarrhea, nausea and vomiting.  Genitourinary:  Positive for dysuria, hematuria, penile discharge and penile pain. Negative for frequency, genital sores and urgency.  Neurological:  Negative for dizziness, light-headedness and  headaches.    Physical Exam Triage Vital Signs ED Triage Vitals  Enc Vitals Group     BP 05/17/21 1052 (!) 142/86     Pulse Rate 05/17/21 1052 (!) 110     Resp 05/17/21 1052 (!) 22     Temp 05/17/21 1052 98.8 F (37.1 C)     Temp Source 05/17/21 1052 Oral     SpO2 05/17/21 1052 97 %     Weight --      Height --      Head Circumference --      Peak Flow --      Pain Score 05/17/21 1047 10     Pain Loc --      Pain Edu? --      Excl. in GC? --    No data found.  Updated Vital Signs BP (!) 137/91 (BP Location: Right Arm)   Pulse 99   Temp 98.8 F (37.1 C)  (Oral)   Resp 20   SpO2 97%   Visual Acuity Right Eye Distance:   Left Eye Distance:   Bilateral Distance:    Right Eye Near:   Left Eye Near:    Bilateral Near:     Physical Exam Vitals reviewed.  Constitutional:      General: He is awake.     Appearance: Normal appearance. He is normal weight. He is not ill-appearing.     Comments: Very pleasant male appears stated age in no acute distress sitting comfortably in exam room  HENT:     Head: Normocephalic and atraumatic.     Mouth/Throat:     Pharynx: Uvula midline. No oropharyngeal exudate or posterior oropharyngeal erythema.  Cardiovascular:     Rate and Rhythm: Normal rate and regular rhythm.     Heart sounds: Normal heart sounds, S1 normal and S2 normal. No murmur heard. Pulmonary:     Effort: Pulmonary effort is normal.     Breath sounds: Normal breath sounds. No stridor. No wheezing, rhonchi or rales.     Comments: Clear to auscultation bilaterally Abdominal:     General: Bowel sounds are normal.     Palpations: Abdomen is soft.     Tenderness: There is no abdominal tenderness. There is no right CVA tenderness, left CVA tenderness, guarding or rebound.     Comments: Benign abdominal exam; no tenderness palpation.  No CVA tenderness.  Neurological:     Mental Status: He is alert.  Psychiatric:        Behavior: Behavior is cooperative.     UC Treatments / Results  Labs (all labs ordered are listed, but only abnormal results are displayed) Labs Reviewed  POCT URINALYSIS DIPSTICK, ED / UC - Abnormal; Notable for the following components:      Result Value   Ketones, ur TRACE (*)    Hgb urine dipstick MODERATE (*)    Protein, ur 30 (*)    Leukocytes,Ua SMALL (*)    All other components within normal limits  URINE CULTURE  CBC WITH DIFFERENTIAL/PLATELET  BASIC METABOLIC PANEL  CYTOLOGY, (ORAL, ANAL, URETHRAL) ANCILLARY ONLY    EKG   Radiology No results found.  Procedures Procedures (including critical  care time)  Medications Ordered in UC Medications  cefTRIAXone (ROCEPHIN) injection 1 g (1 g Intramuscular Given 05/17/21 1155)    Initial Impression / Assessment and Plan / UC Course  I have reviewed the triage vital signs and the nursing notes.  Pertinent labs & imaging results that were  available during my care of the patient were reviewed by me and considered in my medical decision making (see chart for details).      UA showed small leukocyte esterase with moderate hemoglobin consistent with UTI.  Urine culture was obtained and we discussed potential need to change antibiotics based on susceptibilities identified on culture.  STI swab collected-results pending.  BMP and CBC obtained today-results pending.  Discussed that if patient continues to have gross hematuria needs to go to a urologist to rule out bladder cancer particularly given history of smoking.  He was treated with Rocephin in clinic today and started on Bactrim DS.  Discussed the importance of having UA repeated within a few weeks to ensure clearing of hematuria noted today.  Patient does not have a primary care provider so we will try to establish him with 1 via PCP assistance.  Discussed alarm symptoms that warrant emergent evaluation.  Patient was given contact information for urologist and encouraged to follow-up with them if symptoms are not resolving quickly with treatment.  Strict return precautions given to which patient expressed understanding.  Final Clinical Impressions(s) / UC Diagnoses   Final diagnoses:  Gross hematuria  Penile discharge  Abnormal urinalysis     Discharge Instructions      We gave you an antibiotic injection today to cover for urinary tract infection.  Please start Bactrim DS twice daily for 7 days.  If you develop any mouth ulcers or rashes you need to stop the medication and be seen immediately.  We will contact you with your lab work if we need to change anything.  Make sure you are  drinking plenty of fluid as we discussed.  It is very importantly follow-up with either our clinic or your primary care provider within a few weeks (assuming improvement of symptoms with medication) to recheck your urine to make sure blood noticed today goes away.  If you have persistent blood you need to see a urologist as we discussed.     ED Prescriptions     Medication Sig Dispense Auth. Provider   sulfamethoxazole-trimethoprim (BACTRIM DS) 800-160 MG tablet Take 1 tablet by mouth 2 (two) times daily for 7 days. 14 tablet Kellyn Mansfield, Noberto Retort, PA-C      PDMP not reviewed this encounter.   Jeani Hawking, PA-C 05/17/21 1202

## 2021-05-17 NOTE — Discharge Instructions (Addendum)
We gave you an antibiotic injection today to cover for urinary tract infection.  Please start Bactrim DS twice daily for 7 days.  If you develop any mouth ulcers or rashes you need to stop the medication and be seen immediately.  We will contact you with your lab work if we need to change anything.  Make sure you are drinking plenty of fluid as we discussed.  It is very importantly follow-up with either our clinic or your primary care provider within a few weeks (assuming improvement of symptoms with medication) to recheck your urine to make sure blood noticed today goes away.  If you have persistent blood you need to see a urologist as we discussed.

## 2021-05-18 LAB — CYTOLOGY, (ORAL, ANAL, URETHRAL) ANCILLARY ONLY
Chlamydia: NEGATIVE
Comment: NEGATIVE
Comment: NEGATIVE
Comment: NORMAL
Neisseria Gonorrhea: POSITIVE — AB
Trichomonas: NEGATIVE

## 2021-05-18 LAB — URINE CULTURE: Culture: <10000 — AB

## 2021-05-19 ENCOUNTER — Telehealth (HOSPITAL_COMMUNITY): Payer: Self-pay | Admitting: Emergency Medicine

## 2021-05-19 NOTE — Telephone Encounter (Signed)
Per protocol, patient to d/c Bactrim, will need treatment with IM Rocephin 500mg  for positive Gonorrhea   Contacted patient by phone.  Verified identity using two identifiers.  Provided positive result.  Reviewed safe sex practices, notifying partners, and refraining from sexual activities for 7 days from time of treatment.  Patient verified understanding, all questions answered.   [facility] notified

## 2021-05-22 ENCOUNTER — Ambulatory Visit (HOSPITAL_COMMUNITY)
Admission: EM | Admit: 2021-05-22 | Discharge: 2021-05-22 | Discharge status: Against Medical Advice | Payer: Medicaid - Out of State

## 2021-05-22 ENCOUNTER — Other Ambulatory Visit: Payer: Self-pay

## 2021-10-18 ENCOUNTER — Emergency Department (HOSPITAL_COMMUNITY): Payer: Medicaid - Out of State

## 2021-10-18 ENCOUNTER — Other Ambulatory Visit: Payer: Self-pay

## 2021-10-18 ENCOUNTER — Inpatient Hospital Stay (HOSPITAL_COMMUNITY)
Admission: EM | Admit: 2021-10-18 | Discharge: 2021-10-20 | DRG: 896 | Disposition: A | Discharge status: Home / Self Care | Payer: Medicaid - Out of State | Attending: Internal Medicine | Admitting: Internal Medicine

## 2021-10-18 ENCOUNTER — Encounter (HOSPITAL_COMMUNITY): Payer: Self-pay | Admitting: Internal Medicine

## 2021-10-18 DIAGNOSIS — F10939 Alcohol use, unspecified with withdrawal, unspecified: Secondary | ICD-10-CM | POA: Diagnosis present

## 2021-10-18 DIAGNOSIS — E860 Dehydration: Secondary | ICD-10-CM | POA: Diagnosis present

## 2021-10-18 DIAGNOSIS — R7401 Elevation of levels of liver transaminase levels: Secondary | ICD-10-CM | POA: Diagnosis present

## 2021-10-18 DIAGNOSIS — R079 Chest pain, unspecified: Secondary | ICD-10-CM

## 2021-10-18 DIAGNOSIS — R1013 Epigastric pain: Secondary | ICD-10-CM | POA: Diagnosis present

## 2021-10-18 DIAGNOSIS — F10930 Alcohol use, unspecified with withdrawal, uncomplicated: Secondary | ICD-10-CM | POA: Diagnosis not present

## 2021-10-18 DIAGNOSIS — Z720 Tobacco use: Secondary | ICD-10-CM | POA: Diagnosis present

## 2021-10-18 DIAGNOSIS — Z91013 Allergy to seafood: Secondary | ICD-10-CM | POA: Diagnosis not present

## 2021-10-18 DIAGNOSIS — R000 Tachycardia, unspecified: Secondary | ICD-10-CM | POA: Diagnosis present

## 2021-10-18 DIAGNOSIS — K3184 Gastroparesis: Secondary | ICD-10-CM | POA: Diagnosis present

## 2021-10-18 DIAGNOSIS — K449 Diaphragmatic hernia without obstruction or gangrene: Secondary | ICD-10-CM | POA: Diagnosis present

## 2021-10-18 DIAGNOSIS — R112 Nausea with vomiting, unspecified: Secondary | ICD-10-CM

## 2021-10-18 DIAGNOSIS — F17210 Nicotine dependence, cigarettes, uncomplicated: Secondary | ICD-10-CM | POA: Diagnosis present

## 2021-10-18 DIAGNOSIS — K709 Alcoholic liver disease, unspecified: Secondary | ICD-10-CM | POA: Diagnosis present

## 2021-10-18 DIAGNOSIS — F10230 Alcohol dependence with withdrawal, uncomplicated: Secondary | ICD-10-CM | POA: Diagnosis present

## 2021-10-18 DIAGNOSIS — U071 COVID-19: Secondary | ICD-10-CM | POA: Diagnosis present

## 2021-10-18 DIAGNOSIS — R1110 Vomiting, unspecified: Secondary | ICD-10-CM | POA: Diagnosis not present

## 2021-10-18 LAB — COMPREHENSIVE METABOLIC PANEL
ALT: 41 U/L (ref 0–44)
AST: 65 U/L — ABNORMAL HIGH (ref 15–41)
Albumin: 4.2 g/dL (ref 3.5–5.0)
Alkaline Phosphatase: 79 U/L (ref 38–126)
Anion gap: 9 (ref 5–15)
BUN: 7 mg/dL (ref 6–20)
CO2: 23 mmol/L (ref 22–32)
Calcium: 9.2 mg/dL (ref 8.9–10.3)
Chloride: 104 mmol/L (ref 98–111)
Creatinine, Ser: 0.8 mg/dL (ref 0.61–1.24)
GFR, Estimated: >60 mL/min (ref >60)
Glucose, Bld: 94 mg/dL (ref 70–99)
Potassium: 3.8 mmol/L (ref 3.5–5.1)
Sodium: 136 mmol/L (ref 135–145)
Total Bilirubin: 0.7 mg/dL (ref 0.3–1.2)
Total Protein: 8.5 g/dL — ABNORMAL HIGH (ref 6.5–8.1)

## 2021-10-18 LAB — TROPONIN I (HIGH SENSITIVITY)
Troponin I (High Sensitivity): 3 ng/L (ref <18)
Troponin I (High Sensitivity): 3 ng/L (ref <18)

## 2021-10-18 LAB — CBC
HCT: 45.9 % (ref 39.0–52.0)
Hemoglobin: 15.7 g/dL (ref 13.0–17.0)
MCH: 31.5 pg (ref 26.0–34.0)
MCHC: 34.2 g/dL (ref 30.0–36.0)
MCV: 92.2 fL (ref 80.0–100.0)
Platelets: 149 10*3/uL — ABNORMAL LOW (ref 150–400)
RBC: 4.98 MIL/uL (ref 4.22–5.81)
RDW: 14.9 % (ref 11.5–15.5)
WBC: 6.5 10*3/uL (ref 4.0–10.5)
nRBC: 0 % (ref 0.0–0.2)

## 2021-10-18 LAB — LIPASE, BLOOD: Lipase: 36 U/L (ref 11–51)

## 2021-10-18 LAB — D-DIMER, QUANTITATIVE: D-Dimer, Quant: 1.06 ug/mL-FEU — ABNORMAL HIGH (ref 0.00–0.50)

## 2021-10-18 MED ORDER — SODIUM CHLORIDE 0.9 % IV BOLUS
1000.0000 mL | Freq: Once | INTRAVENOUS | Status: AC
  Administered 2021-10-18: 1000 mL via INTRAVENOUS

## 2021-10-18 MED ORDER — METOCLOPRAMIDE HCL 5 MG/ML IJ SOLN
5.0000 mg | Freq: Three times a day (TID) | INTRAMUSCULAR | Status: DC
  Administered 2021-10-18 – 2021-10-19 (×3): 5 mg via INTRAVENOUS
  Filled 2021-10-18 (×3): qty 2

## 2021-10-18 MED ORDER — ONDANSETRON HCL 4 MG/2ML IJ SOLN: 4.0000 mg | Freq: Four times a day (QID) | INTRAMUSCULAR | Status: DC | PRN

## 2021-10-18 MED ORDER — THIAMINE HCL 100 MG PO TABS: 100.0000 mg | ORAL_TABLET | Freq: Every day | ORAL | Status: DC

## 2021-10-18 MED ORDER — LORAZEPAM 1 MG PO TABS
1.0000 mg | ORAL_TABLET | ORAL | Status: DC | PRN
  Administered 2021-10-19: 1 mg via ORAL
  Filled 2021-10-18: qty 1

## 2021-10-18 MED ORDER — KCL IN DEXTROSE-NACL 20-5-0.9 MEQ/L-%-% IV SOLN
INTRAVENOUS | Status: DC
  Filled 2021-10-18 (×6): qty 1000

## 2021-10-18 MED ORDER — ONDANSETRON HCL 4 MG/2ML IJ SOLN
4.0000 mg | Freq: Once | INTRAMUSCULAR | Status: AC
  Administered 2021-10-18: 4 mg via INTRAVENOUS
  Filled 2021-10-18: qty 2

## 2021-10-18 MED ORDER — IOHEXOL 350 MG/ML SOLN
100.0000 mL | Freq: Once | INTRAVENOUS | Status: AC | PRN
  Administered 2021-10-18: 100 mL via INTRAVENOUS

## 2021-10-18 MED ORDER — THIAMINE HCL 100 MG/ML IJ SOLN: 100.0000 mg | Freq: Every day | INTRAMUSCULAR | Status: DC

## 2021-10-18 MED ORDER — LORAZEPAM 2 MG/ML IJ SOLN
1.0000 mg | INTRAMUSCULAR | Status: DC | PRN
  Administered 2021-10-18 – 2021-10-19 (×2): 2 mg via INTRAVENOUS
  Filled 2021-10-18 (×2): qty 1

## 2021-10-18 MED ORDER — THIAMINE HCL 100 MG/ML IJ SOLN
100.0000 mg | Freq: Once | INTRAMUSCULAR | Status: AC
Start: 2021-10-18 — End: 2021-10-18
  Administered 2021-10-18: 100 mg via INTRAVENOUS
  Filled 2021-10-18: qty 2

## 2021-10-18 MED ORDER — IBUPROFEN 200 MG PO TABS
200.0000 mg | ORAL_TABLET | Freq: Four times a day (QID) | ORAL | Status: DC | PRN
  Administered 2021-10-18 – 2021-10-19 (×2): 200 mg via ORAL
  Filled 2021-10-18 (×2): qty 1

## 2021-10-18 MED ORDER — ADULT MULTIVITAMIN W/MINERALS CH
1.0000 | ORAL_TABLET | Freq: Every day | ORAL | Status: DC
  Administered 2021-10-18 – 2021-10-20 (×3): 1 via ORAL
  Filled 2021-10-18 (×3): qty 1

## 2021-10-18 MED ORDER — THIAMINE HCL 100 MG PO TABS
100.0000 mg | ORAL_TABLET | Freq: Every day | ORAL | Status: DC
  Administered 2021-10-19 – 2021-10-20 (×2): 100 mg via ORAL
  Filled 2021-10-18 (×2): qty 1

## 2021-10-18 MED ORDER — FOLIC ACID 1 MG PO TABS
1.0000 mg | ORAL_TABLET | Freq: Every day | ORAL | Status: DC
  Administered 2021-10-18 – 2021-10-20 (×3): 1 mg via ORAL
  Filled 2021-10-18 (×3): qty 1

## 2021-10-18 MED ORDER — LORAZEPAM 2 MG/ML IJ SOLN
2.0000 mg | Freq: Once | INTRAMUSCULAR | Status: AC
Start: 2021-10-18 — End: 2021-10-18
  Administered 2021-10-18: 2 mg via INTRAVENOUS
  Filled 2021-10-18: qty 1

## 2021-10-18 MED ORDER — PANTOPRAZOLE SODIUM 40 MG IV SOLR
40.0000 mg | Freq: Every day | INTRAVENOUS | Status: DC
  Administered 2021-10-18: 40 mg via INTRAVENOUS
  Filled 2021-10-18: qty 40

## 2021-10-18 MED ORDER — ONDANSETRON HCL 4 MG PO TABS: 4.0000 mg | ORAL_TABLET | Freq: Four times a day (QID) | ORAL | Status: DC | PRN

## 2021-10-18 MED ORDER — TRAMADOL HCL 50 MG PO TABS: 50.0000 mg | ORAL_TABLET | Freq: Three times a day (TID) | ORAL | Status: DC | PRN

## 2021-10-18 MED ORDER — CLONIDINE HCL 0.1 MG PO TABS
0.1000 mg | ORAL_TABLET | Freq: Three times a day (TID) | ORAL | Status: DC
  Administered 2021-10-18 – 2021-10-20 (×6): 0.1 mg via ORAL
  Filled 2021-10-18 (×6): qty 1

## 2021-10-18 NOTE — ED Triage Notes (Signed)
Pt reports substernal CP that began at 0300.  +nausea/vomiting.  Pt shaking and clutching chest in triage.

## 2021-10-18 NOTE — H&P (Signed)
ADMISSION HISTORY AND PHYSICAL   Jay Gross JIR:[id number] DOB: Jun 19, 1971 DOA: 10/18/2021  PCP: Patient, No Pcp Per (Inactive) Patient coming from: [facility] ED  Chief Complaint: Intractable vomiting - possible alcohol withdrawal  HPI:  51yo with no significant chronic medical history who presented to the ED with a 2 to 3-day history of intractable vomiting to the point that he is not able to tolerate oral intake of anything other than small sips of water.  This is been associated with some epigastric pain.  The patient states he suffers 2-3 episodes of this each year and thus far has not been able to figure out why.  Assessment/Plan  Intractable nausea and vomiting  Could be due to EtOH withdrawal, but other etiologies must be considered as well - small HH noted on CTa chest - rule out DM - trial of reglan on schedule - monitor electrolytes - support w/ D5 IVF + KCl - empiric PPI   Possible Acute Alcohol Withdrawal without delirium Initiate ativan tx via CIWA protocol - monitor in Progressive Care bed   Sinus Tachycardia Due to above - utilize clonidine in addition to ativan protocol   Small HH Noted on CTa chest this admit - begin PPI   Tobacco abuse Counsel on need to stop smoking - nicotine patch as needed    DVT prophylaxis: SCDs Code Status: FULL Family Communication: no family present at time of exam  Disposition Plan:  Admit to Inpatient  Consults called: none indicated  Review of Systems: As per HPI otherwise 10 point review of systems negative.   No past medical history on file.  Past Surgical History:  Procedure Laterality Date   HAND SURGERY     JOINT REPLACEMENT      Family History  Family History  Problem Relation Age of Onset   Hypertension Mother     Social History   reports that he has been smoking cigarettes. He has been smoking an average of .5 packs per day. He has never used smokeless tobacco. He reports current alcohol use. He reports that he  does not use drugs.  Allergies Allergies  Allergen Reactions   Other Nausea Only    seafood    Prior to Admission medications   Medication Sig Start Date End Date Taking? Authorizing Provider  acetaminophen (TYLENOL) 325 MG tablet Take 650 mg by mouth every 6 (six) hours as needed for mild pain.   Yes [provider]  ibuprofen (ADVIL) 200 MG tablet Take 400 mg by mouth every 6 (six) hours as needed for mild pain.   Yes [provider]    Physical Exam: Vitals:   10/18/21 1434 10/18/21 1445 10/18/21 1500 10/18/21 1637  BP: 119/85 139/90 (!) 140/93 (!) 132/92  Pulse: (!) 112 (!) 117 (!) 110 (!) 111  Resp: (!) 27 (!) 26 (!) 31 (!) 23  Temp:      TempSrc:      SpO2: 96% 96% 95% 93%    Constitutional: NAD, calm but tremulous - vomiting up clear liquids  Eyes: PERRL, lids and conjunctivae normal but dry  ENMT: Mucous membranes are dry.  Neck: normal, supple, no masses, no thyromegaly Respiratory: clear to auscultation bilaterally, no wheezing, no crackles. Normal respiratory effort. No accessory muscle use.  Cardiovascular: tachycardic - no M or rub  Abdomen: No tenderness or masses to palpation. No hepatosplenomegaly. Bowel sounds positive. Not distended. Soft.  Musculoskeletal: No clubbing / cyanosis. No joint deformity upper and lower extremities. No contractures.  Normal muscle tone.  Skin: No rashes, lesions, ulcers.  Neurologic: CN 2-12 grossly intact B. Sensation intact. Strength 5/5 in all 4 extremities.  Psychiatric: Normal judgment and insight. Alert and oriented x 3. Normal mood.    Labs on Admission:   CBC: Recent Labs  Lab 10/18/21 1303  WBC 6.5  HGB 15.7  HCT 45.9  MCV 92.2  PLT 149*   Basic Metabolic Panel: Recent Labs  Lab 10/18/21 1303  NA 136  K 3.8  CL 104  CO2 23  GLUCOSE 94  BUN 7  CREATININE 0.80  CALCIUM 9.2    Liver Function Tests: Recent Labs  Lab 10/18/21 1303  AST 65*  ALT 41  ALKPHOS 79  BILITOT 0.7  PROT  8.5*  ALBUMIN 4.2   Recent Labs  Lab 10/18/21 1303  LIPASE 36   Urine analysis:    Component Value Date/Time   COLORURINE STRAW (A) 02/02/2021 1559   APPEARANCEUR CLEAR 02/02/2021 1559   LABSPEC 1.015 05/17/2021 1115   PHURINE 5.0 05/17/2021 1115   GLUCOSEU NEGATIVE 05/17/2021 1115   HGBUR MODERATE (A) 05/17/2021 1115   BILIRUBINUR NEGATIVE 05/17/2021 1115   KETONESUR TRACE (A) 05/17/2021 1115   PROTEINUR 30 (A) 05/17/2021 1115   UROBILINOGEN 0.2 05/17/2021 1115   NITRITE NEGATIVE 05/17/2021 1115   LEUKOCYTESUR SMALL (A) 05/17/2021 1115     Radiological Exams on Admission: DG Chest 2 View  Result Date: 10/18/2021 CLINICAL DATA:  Chest pain EXAM: CHEST - 2 VIEW COMPARISON:  02/02/2021 FINDINGS: Cardiac and mediastinal contours are within normal limits. No focal pulmonary opacity. No pleural effusion or pneumothorax. No acute osseous abnormality. IMPRESSION: No acute cardiopulmonary process. Electronically Signed   By: Wiliam KeAlison  Vasan M.D.   On: 10/18/2021 12:19   CT Angio Chest PE W and/or Wo Contrast  Result Date: 10/18/2021 CLINICAL DATA:  Chest pain and vomiting since 3 a.m. EXAM: CT ANGIOGRAPHY CHEST WITH CONTRAST TECHNIQUE: Multidetector CT imaging of the chest was performed using the standard protocol during bolus administration of intravenous contrast. Multiplanar CT image reconstructions and MIPs were obtained to evaluate the vascular anatomy. RADIATION DOSE REDUCTION: This exam was performed according to the departmental dose-optimization program which includes automated exposure control, adjustment of the mA and/or kV according to patient size and/or use of iterative reconstruction technique. CONTRAST:  100mL OMNIPAQUE IOHEXOL 350 MG/ML SOLN COMPARISON:  10/18/2021 FINDINGS: Cardiovascular: This is a technically adequate evaluation of the pulmonary vasculature. No filling defects or pulmonary emboli. The heart is unremarkable without pericardial effusion. No evidence of  thoracic aortic aneurysm or dissection. Mediastinum/Nodes: No enlarged mediastinal, hilar, or axillary lymph nodes. Thyroid gland, trachea, and esophagus demonstrate no significant findings. Small hiatal hernia. Lungs/Pleura: No airspace disease, effusion, or pneumothorax. Minimal hypoventilatory changes at the right base. Central airways are patent. Upper Abdomen: Diffuse hepatic steatosis. No acute upper abdominal finding. Musculoskeletal: No acute or destructive bony lesions. Reconstructed images demonstrate no additional findings. Review of the MIP images confirms the above findings. IMPRESSION: 1. No evidence of pulmonary embolus. 2. No acute intrathoracic process. 3. Small hiatal hernia. 4. Hepatic steatosis. Electronically Signed   By: Sharlet SalinaMichael  Brown M.D.   On: 10/18/2021 17:01   DG Foot Complete Right  Result Date: 10/18/2021 CLINICAL DATA:  Foot injury. Longstanding wound in the fifth toe with increasing pain. EXAM: RIGHT FOOT COMPLETE - 3+ VIEW COMPARISON:  None. FINDINGS: Decreased Bohler angle may reflect a remote calcaneal injury. No definite acute fracture, dislocation, or destructive osseous lesion is  identified. The soft tissues are unremarkable. IMPRESSION: No acute osseous abnormality identified. Electronically Signed   By: Sebastian Ache M.D.   On: 10/18/2021 13:13    EKG: Independently reviewed. Sinus tachycardia noted.   Lonia Blood, MD [facility] Office  [phone] Pager - Text Page per Amion as per below:  On-Call/Text Page:      [email or web address]  If 7PM-7AM, please contact night-coverage [email or web address] 10/18/2021, 5:33 PM

## 2021-10-18 NOTE — ED Provider Notes (Addendum)
[facility]-EMERGENCY DEPT Provider Note   CSN: [id number] Arrival date & time: 10/18/21  1128     History  Chief Complaint  Patient presents with   Chest Pain    Jay Gross is a 51 y.o. male.  Patient presents chief complaint of chest pain and vomiting ongoing since 3 AM last night.  Described as sharp and aching and persistent.  Does not radiate elsewhere.  No associated fevers no associated diarrhea.  No abdominal pain reported.  Patient states he does drink alcohol regularly last drink was 2 days ago because he could not "keep anything down.      Home Medications Prior to Admission medications   Medication Sig Start Date End Date Taking? Authorizing Provider  acetaminophen (TYLENOL) 325 MG tablet Take 650 mg by mouth every 6 (six) hours as needed for mild pain.   Yes [provider]  ibuprofen (ADVIL) 200 MG tablet Take 400 mg by mouth every 6 (six) hours as needed for mild pain.   Yes [provider]      Allergies    Other    Review of Systems   Review of Systems  Constitutional:  Negative for fever.  HENT:  Negative for ear pain and sore throat.   Eyes:  Negative for pain.  Respiratory:  Negative for cough.   Cardiovascular:  Positive for chest pain.  Gastrointestinal:  Negative for abdominal pain.  Genitourinary:  Negative for flank pain.  Musculoskeletal:  Negative for back pain.  Skin:  Negative for color change and rash.  Neurological:  Negative for syncope.  All other systems reviewed and are negative.  Physical Exam Updated Vital Signs BP (!) 132/92    Pulse (!) 111    Temp 98.5 F (36.9 C) (Oral)    Resp (!) 23    SpO2 93%  Physical Exam Constitutional:      Appearance: He is well-developed.  HENT:     Head: Normocephalic.     Nose: Nose normal.  Eyes:     Extraocular Movements: Extraocular movements intact.  Cardiovascular:     Rate and Rhythm: Tachycardia present.  Pulmonary:     Effort: Pulmonary  effort is normal.  Skin:    Coloration: Skin is not jaundiced.  Neurological:     Mental Status: He is alert. Mental status is at baseline.    ED Results / Procedures / Treatments   Labs (all labs ordered are listed, but only abnormal results are displayed) Labs Reviewed  CBC - Abnormal; Notable for the following components:      Result Value   Platelets 149 (*)    All other components within normal limits  COMPREHENSIVE METABOLIC PANEL - Abnormal; Notable for the following components:   Total Protein 8.5 (*)    AST 65 (*)    All other components within normal limits  D-DIMER, QUANTITATIVE - Abnormal; Notable for the following components:   D-Dimer, Quant 1.06 (*)    All other components within normal limits  LIPASE, BLOOD  TROPONIN I (HIGH SENSITIVITY)  TROPONIN I (HIGH SENSITIVITY)    EKG EKG Interpretation  Date/Time:  Wednesday October 18 2021 11:36:54 EST Ventricular Rate:  133 PR Interval:  138 QRS Duration: 84 QT Interval:  302 QTC Calculation: 449 R Axis:   89 Text Interpretation: Sinus tachycardia Otherwise normal ECG When compared with ECG of 02-Feb-2021 16:57, PREVIOUS ECG IS PRESENT Confirmed by Norman Clay ([id number]) on 10/18/2021 1:04:31 PM  Radiology DG Chest 2  View  Result Date: 10/18/2021 CLINICAL DATA:  Chest pain EXAM: CHEST - 2 VIEW COMPARISON:  02/02/2021 FINDINGS: Cardiac and mediastinal contours are within normal limits. No focal pulmonary opacity. No pleural effusion or pneumothorax. No acute osseous abnormality. IMPRESSION: No acute cardiopulmonary process. Electronically Signed   By: Wiliam KeAlison  Vasan M.D.   On: 10/18/2021 12:19   CT Angio Chest PE W and/or Wo Contrast  Result Date: 10/18/2021 CLINICAL DATA:  Chest pain and vomiting since 3 a.m. EXAM: CT ANGIOGRAPHY CHEST WITH CONTRAST TECHNIQUE: Multidetector CT imaging of the chest was performed using the standard protocol during bolus administration of intravenous contrast. Multiplanar CT image  reconstructions and MIPs were obtained to evaluate the vascular anatomy. RADIATION DOSE REDUCTION: This exam was performed according to the departmental dose-optimization program which includes automated exposure control, adjustment of the mA and/or kV according to patient size and/or use of iterative reconstruction technique. CONTRAST:  100mL OMNIPAQUE IOHEXOL 350 MG/ML SOLN COMPARISON:  10/18/2021 FINDINGS: Cardiovascular: This is a technically adequate evaluation of the pulmonary vasculature. No filling defects or pulmonary emboli. The heart is unremarkable without pericardial effusion. No evidence of thoracic aortic aneurysm or dissection. Mediastinum/Nodes: No enlarged mediastinal, hilar, or axillary lymph nodes. Thyroid gland, trachea, and esophagus demonstrate no significant findings. Small hiatal hernia. Lungs/Pleura: No airspace disease, effusion, or pneumothorax. Minimal hypoventilatory changes at the right base. Central airways are patent. Upper Abdomen: Diffuse hepatic steatosis. No acute upper abdominal finding. Musculoskeletal: No acute or destructive bony lesions. Reconstructed images demonstrate no additional findings. Review of the MIP images confirms the above findings. IMPRESSION: 1. No evidence of pulmonary embolus. 2. No acute intrathoracic process. 3. Small hiatal hernia. 4. Hepatic steatosis. Electronically Signed   By: Sharlet SalinaMichael  Brown M.D.   On: 10/18/2021 17:01   DG Foot Complete Right  Result Date: 10/18/2021 CLINICAL DATA:  Foot injury. Longstanding wound in the fifth toe with increasing pain. EXAM: RIGHT FOOT COMPLETE - 3+ VIEW COMPARISON:  None. FINDINGS: Decreased Bohler angle may reflect a remote calcaneal injury. No definite acute fracture, dislocation, or destructive osseous lesion is identified. The soft tissues are unremarkable. IMPRESSION: No acute osseous abnormality identified. Electronically Signed   By: Sebastian AcheAllen  Grady M.D.   On: 10/18/2021 13:13    Procedures Procedures     Medications Ordered in ED Medications  sodium chloride 0.9 % bolus 1,000 mL (0 mLs Intravenous Stopped 10/18/21 1517)  ondansetron (ZOFRAN) injection 4 mg (4 mg Intravenous Given 10/18/21 1333)  LORazepam (ATIVAN) injection 2 mg (2 mg Intravenous Given 10/18/21 1333)  iohexol (OMNIPAQUE) 350 MG/ML injection 100 mL (100 mLs Intravenous Contrast Given 10/18/21 1642)    ED Course/ Medical Decision Making/ A&P                           Medical Decision Making Patient admits to chronic alcohol use, presents with tachycardia and chest pain.  Amount and/or Complexity of Data Reviewed Labs: ordered. Decision-making details documented in ED Course. Radiology: ordered. Decision-making details documented in ED Course. ECG/medicine tests: ordered and independent interpretation performed. Decision-making details documented in ED Course.  Risk Prescription drug management. Risk Details: Differential includes pulmonary embolism versus pneumothorax.  Less likely aortic dissection.  Labs are otherwise unremarkable initial troponin is negative.  D-dimer pursued and CT angio of the chest pursued.   CT angio was negative for pulm embolism.  Patient still requiring Ativan, will be admitted to the hospitalist team.  Final Clinical Impression(s) / ED Diagnoses Final diagnoses:  Nonspecific chest pain  Alcohol withdrawal syndrome without complication [facility])    Rx / DC Orders ED Discharge Orders     None         Cheryll Cockayne, MD 10/18/21 1647    Cheryll Cockayne, MD 10/18/21 [phone]

## 2021-10-18 NOTE — Progress Notes (Signed)
CSW met with pt regarding lack of PCP.  Pt not feeling well, threw up at one point.  CSW provided information on [facility].  Pt said he has medicaid from state of [place] that has not been transferred to [place] yet, said his wife is working on it.  CSW provided contact information for [facility]. Pt verbalized understanding. Lurline Idol, MSW, LCSW 1/18/20236:01 PM

## 2021-10-18 NOTE — ED Provider Triage Note (Addendum)
Emergency Medicine Provider Triage Evaluation Note  Jay Gross , a 51 y.o. male  was evaluated in triage.  Pt complains of chest pain.  He states that same came on suddenly around 3:00 this morning and woke him from sleep.  Pain is substernal and radiates down into his epigastric region.  He states it feels like heartburn.  He has associated nausea and vomiting.  No history of heart problems. Also endorses R foot wound that has been present for 2 years. He is not diabetic  Review of Systems  Positive: CP, SOB, n/v Negative: Fever, chills  Physical Exam  BP (!) 131/99 (BP Location: Right Arm)    Pulse (!) 138    Temp 98.5 F (36.9 C) (Oral)    Resp (!) 22    SpO2 100%  Gen:   Awake, no distress   Resp:  Normal effort  MSK:   Moves extremities without difficulty  Other:    Medical Decision Making  Medically screening exam initiated at 12:15 PM.  Appropriate orders placed.  Jay Gross was informed that the remainder of the evaluation will be completed by another provider, this initial triage assessment does not replace that evaluation, and the importance of remaining in the ED until their evaluation is complete.     Jay Gross 10/18/21 1216    Jay Gross, Shawn Route, PA-C 10/18/21 1228

## 2021-10-19 DIAGNOSIS — K449 Diaphragmatic hernia without obstruction or gangrene: Secondary | ICD-10-CM

## 2021-10-19 DIAGNOSIS — E860 Dehydration: Secondary | ICD-10-CM

## 2021-10-19 DIAGNOSIS — R1110 Vomiting, unspecified: Secondary | ICD-10-CM

## 2021-10-19 DIAGNOSIS — U071 COVID-19: Secondary | ICD-10-CM

## 2021-10-19 LAB — HIV ANTIBODY (ROUTINE TESTING W REFLEX): HIV Screen 4th Generation wRfx: NONREACTIVE

## 2021-10-19 LAB — CBC
HCT: 42.8 % (ref 39.0–52.0)
Hemoglobin: 14.5 g/dL (ref 13.0–17.0)
MCH: 31.8 pg (ref 26.0–34.0)
MCHC: 33.9 g/dL (ref 30.0–36.0)
MCV: 93.9 fL (ref 80.0–100.0)
Platelets: 121 10*3/uL — ABNORMAL LOW (ref 150–400)
RBC: 4.56 MIL/uL (ref 4.22–5.81)
RDW: 14.7 % (ref 11.5–15.5)
WBC: 6.2 10*3/uL (ref 4.0–10.5)
nRBC: 0 % (ref 0.0–0.2)

## 2021-10-19 LAB — COMPREHENSIVE METABOLIC PANEL
ALT: 109 U/L — ABNORMAL HIGH (ref 0–44)
AST: 246 U/L — ABNORMAL HIGH (ref 15–41)
Albumin: 3.6 g/dL (ref 3.5–5.0)
Alkaline Phosphatase: 63 U/L (ref 38–126)
Anion gap: 9 (ref 5–15)
BUN: 10 mg/dL (ref 6–20)
CO2: 25 mmol/L (ref 22–32)
Calcium: 8.7 mg/dL — ABNORMAL LOW (ref 8.9–10.3)
Chloride: 103 mmol/L (ref 98–111)
Creatinine, Ser: 0.81 mg/dL (ref 0.61–1.24)
GFR, Estimated: >60 mL/min (ref >60)
Glucose, Bld: 97 mg/dL (ref 70–99)
Potassium: 3.5 mmol/L (ref 3.5–5.1)
Sodium: 137 mmol/L (ref 135–145)
Total Bilirubin: 0.6 mg/dL (ref 0.3–1.2)
Total Protein: 7.6 g/dL (ref 6.5–8.1)

## 2021-10-19 LAB — RESP PANEL BY RT-PCR (FLU A&B, COVID) ARPGX2
Influenza A by PCR: NEGATIVE
Influenza B by PCR: NEGATIVE
SARS Coronavirus 2 by RT PCR: POSITIVE — AB

## 2021-10-19 LAB — TSH: TSH: 1.131 u[IU]/mL (ref 0.350–4.500)

## 2021-10-19 LAB — PHOSPHORUS: Phosphorus: 3.7 mg/dL (ref 2.5–4.6)

## 2021-10-19 LAB — HEMOGLOBIN A1C
Hgb A1c MFr Bld: 6.1 % — ABNORMAL HIGH (ref 4.8–5.6)
Mean Plasma Glucose: 128.37 mg/dL

## 2021-10-19 LAB — MAGNESIUM: Magnesium: 1.9 mg/dL (ref 1.7–2.4)

## 2021-10-19 MED ORDER — LOPERAMIDE HCL 2 MG PO CAPS
2.0000 mg | ORAL_CAPSULE | ORAL | Status: DC | PRN
  Administered 2021-10-19: 2 mg via ORAL
  Filled 2021-10-19: qty 1

## 2021-10-19 MED ORDER — PANTOPRAZOLE SODIUM 40 MG PO TBEC
40.0000 mg | DELAYED_RELEASE_TABLET | Freq: Every day | ORAL | Status: DC
  Administered 2021-10-19 – 2021-10-20 (×2): 40 mg via ORAL
  Filled 2021-10-19 (×2): qty 1

## 2021-10-19 NOTE — ED Notes (Signed)
ED TO INPATIENT HANDOFF REPORT  Name/Age/Gender Jay Gross 51 y.o. male  Code Status    Code Status Orders  (From admission, onward)           Start     Ordered   10/18/21 1811  Full code  Continuous        10/18/21 1812           Code Status History     This patient has a current code status but no historical code status.       Home/SNF/Other Home  Chief Complaint Alcohol withdrawal (HCC) [F10.939]  Level of Care/Admitting Diagnosis ED Disposition     ED Disposition  Admit   Condition  --   Comment  Hospital [facility]: [facility] [[id number]]  Level of Care: Progressive [102]  Admit to Progressive based on following criteria: NEUROLOGICAL AND NEUROSURGICAL complex patients with significant risk of instability, who do not meet ICU criteria, yet require close observation or frequent assessment (< / = every 2 - 4 hours) with medical / nursing intervention.  May admit patient to [facility] or [facility] if equivalent level of care is available:: Yes  Covid Evaluation: Asymptomatic Screening Protocol (No Symptoms)  Diagnosis: Alcohol withdrawal (HCC) [291.81.ICD-9-CM]  Admitting Physician: Sharon SellerMCCLUNG, JEFFREY T [[id number]]  Attending Physician: Sharon SellerMCCLUNG, JEFFREY T [[id number]]  Estimated length of stay: past midnight tomorrow  Certification:: I certify this patient will need inpatient services for at least 2 midnights          Medical History History reviewed. No pertinent past medical history.  Allergies Allergies  Allergen Reactions   Other Nausea Only    seafood    IV Location/Drains/Wounds Patient Lines/Drains/Airways Status     Active Line/Drains/Airways     Name Placement date Placement time Site Days   Peripheral IV 10/18/21 20 G 1" Anterior;Distal;Left Forearm 10/18/21  1301  Forearm  1            Labs/Imaging Results for orders placed or performed during the hospital encounter of 10/18/21 (from the past 48 hour(s))  CBC      Status: Abnormal   Collection Time: 10/18/21  1:03 PM  Result Value Ref Range   WBC 6.5 4.0 - 10.5 K/uL   RBC 4.98 4.22 - 5.81 MIL/uL   Hemoglobin 15.7 13.0 - 17.0 g/dL   HCT 40.945.9 81.139.0 - 91.452.0 %   MCV 92.2 80.0 - 100.0 fL   MCH 31.5 26.0 - 34.0 pg   MCHC 34.2 30.0 - 36.0 g/dL   RDW 78.214.9 95.611.5 - 21.315.5 %   Platelets 149 (L) 150 - 400 K/uL   nRBC 0.0 0.0 - 0.2 %    Comment: Performed at [facility], [address] [place], [place] [address]  Troponin I (High Sensitivity)     Status: None   Collection Time: 10/18/21  1:03 PM  Result Value Ref Range   Troponin I (High Sensitivity) 3 <18 ng/L    Comment: (NOTE) Elevated high sensitivity troponin I (hsTnI) values and significant  changes across serial measurements may suggest ACS but many other  chronic and acute conditions are known to elevate hsTnI results.  Refer to the "Links" section for chest pain algorithms and additional  guidance. Performed at [facility], [address] [place], [place] [address]   Comprehensive metabolic panel     Status: Abnormal   Collection Time: 10/18/21  1:03 PM  Result Value Ref Range   Sodium  136 135 - 145 mmol/L   Potassium 3.8 3.5 - 5.1 mmol/L   Chloride 104 98 - 111 mmol/L   CO2 23 22 - 32 mmol/L   Glucose, Bld 94 70 - 99 mg/dL    Comment: Glucose reference range applies only to samples taken after fasting for at least 8 hours.   BUN 7 6 - 20 mg/dL   Creatinine, Ser 5.00 0.61 - 1.24 mg/dL   Calcium 9.2 8.9 - 37.0 mg/dL   Total Protein 8.5 (H) 6.5 - 8.1 g/dL   Albumin 4.2 3.5 - 5.0 g/dL   AST 65 (H) 15 - 41 U/L   ALT 41 0 - 44 U/L   Alkaline Phosphatase 79 38 - 126 U/L   Total Bilirubin 0.7 0.3 - 1.2 mg/dL   GFR, Estimated >48 >88 mL/min    Comment: (NOTE) Calculated using the CKD-EPI Creatinine Equation (2021)    Anion gap 9 5 - 15    Comment: Performed at [facility], [address], [place], [place] [address]  Lipase, blood      Status: None   Collection Time: 10/18/21  1:03 PM  Result Value Ref Range   Lipase 36 11 - 51 U/L    Comment: Performed at [facility], [address], [place], [place] [address]  Troponin I (High Sensitivity)     Status: None   Collection Time: 10/18/21  4:12 PM  Result Value Ref Range   Troponin I (High Sensitivity) 3 <18 ng/L    Comment: (NOTE) Elevated high sensitivity troponin I (hsTnI) values and significant  changes across serial measurements may suggest ACS but many other  chronic and acute conditions are known to elevate hsTnI results.  Refer to the "Links" section for chest pain algorithms and additional  guidance. Performed at [facility], [address], [place], [place] [address]   D-dimer, quantitative     Status: Abnormal   Collection Time: 10/18/21  4:12 PM  Result Value Ref Range   D-Dimer, Quant 1.06 (H) 0.00 - 0.50 ug/mL-FEU    Comment: (NOTE) At the manufacturer cut-off value of 0.5 g/mL FEU, this assay has a negative predictive value of 95-100%.This assay is intended for use in conjunction with a clinical pretest probability (PTP) assessment model to exclude pulmonary embolism (PE) and deep venous thrombosis (DVT) in outpatients suspected of PE or DVT. Results should be correlated with clinical presentation. Performed at [facility], [address], [place], [place] [address]   Resp Panel by RT-PCR (Flu A&B, Covid) Nasopharyngeal Swab     Status: Abnormal   Collection Time: 10/19/21  1:54 AM   Specimen: Nasopharyngeal Swab; Nasopharyngeal(NP) swabs in vial transport medium  Result Value Ref Range   SARS Coronavirus 2 by RT PCR POSITIVE (A) NEGATIVE    Comment: (NOTE) SARS-CoV-2 target nucleic acids are DETECTED.  The SARS-CoV-2 RNA is generally detectable in upper respiratory specimens during the acute phase of infection. Positive results are indicative of the presence of the identified  virus, but do not rule out bacterial infection or co-infection with other pathogens not detected by the test. Clinical correlation with patient history and other diagnostic information is necessary to determine patient infection status. The expected result is Negative.  Fact Sheet for Patients: BloggerCourse.com  Fact Sheet for Healthcare Providers: SeriousBroker.it  This test is not yet approved or cleared by the [place] FDA and  has been authorized for detection and/or diagnosis of SARS-CoV-2  by FDA under an Emergency Use Authorization (EUA).  This EUA will remain in effect (meaning this test can be used) for the duration of  the COVID-19 declaration under Section 564(b)(1) of the A ct, 21 U.S.C. section 360bbb-3(b)(1), unless the authorization is terminated or revoked sooner.     Influenza A by PCR NEGATIVE NEGATIVE   Influenza B by PCR NEGATIVE NEGATIVE    Comment: (NOTE) The Xpert Xpress SARS-CoV-2/FLU/RSV plus assay is intended as an aid in the diagnosis of influenza from Nasopharyngeal swab specimens and should not be used as a sole basis for treatment. Nasal washings and aspirates are unacceptable for Xpert Xpress SARS-CoV-2/FLU/RSV testing.  Fact Sheet for Patients: BloggerCourse.com  Fact Sheet for Healthcare Providers: SeriousBroker.it  This test is not yet approved or cleared by the [place] FDA and has been authorized for detection and/or diagnosis of SARS-CoV-2 by FDA under an Emergency Use Authorization (EUA). This EUA will remain in effect (meaning this test can be used) for the duration of the COVID-19 declaration under Section 564(b)(1) of the Act, 21 U.S.C. section 360bbb-3(b)(1), unless the authorization is terminated or revoked.  Performed at [facility], [address] [place], [place] [address]   Comprehensive metabolic  panel     Status: Abnormal   Collection Time: 10/19/21  4:52 AM  Result Value Ref Range   Sodium 137 135 - 145 mmol/L   Potassium 3.5 3.5 - 5.1 mmol/L   Chloride 103 98 - 111 mmol/L   CO2 25 22 - 32 mmol/L   Glucose, Bld 97 70 - 99 mg/dL    Comment: Glucose reference range applies only to samples taken after fasting for at least 8 hours.   BUN 10 6 - 20 mg/dL   Creatinine, Ser 6.04 0.61 - 1.24 mg/dL   Calcium 8.7 (L) 8.9 - 10.3 mg/dL   Total Protein 7.6 6.5 - 8.1 g/dL   Albumin 3.6 3.5 - 5.0 g/dL   AST 540 (H) 15 - 41 U/L   ALT 109 (H) 0 - 44 U/L   Alkaline Phosphatase 63 38 - 126 U/L   Total Bilirubin 0.6 0.3 - 1.2 mg/dL   GFR, Estimated >98 >11 mL/min    Comment: (NOTE) Calculated using the CKD-EPI Creatinine Equation (2021)    Anion gap 9 5 - 15    Comment: Performed at [facility], [address] [place], [place] [address]  CBC     Status: Abnormal   Collection Time: 10/19/21  4:52 AM  Result Value Ref Range   WBC 6.2 4.0 - 10.5 K/uL   RBC 4.56 4.22 - 5.81 MIL/uL   Hemoglobin 14.5 13.0 - 17.0 g/dL   HCT 29.5 62.1 - 30.8 %   MCV 93.9 80.0 - 100.0 fL   MCH 31.8 26.0 - 34.0 pg   MCHC 33.9 30.0 - 36.0 g/dL   RDW 65.7 84.6 - 96.2 %   Platelets 121 (L) 150 - 400 K/uL    Comment: Immature Platelet Fraction may be clinically indicated, consider ordering this additional test [id number] REPEATED TO VERIFY    nRBC 0.0 0.0 - 0.2 %    Comment: Performed at [facility], [address] [place], [place] [address]  Magnesium     Status: None   Collection Time: 10/19/21  4:52 AM  Result Value Ref Range   Magnesium 1.9 1.7 - 2.4 mg/dL    Comment: Performed at [facility], [address]  [place], [place] [address]  Phosphorus     Status: None   Collection Time: 10/19/21  4:52 AM  Result Value Ref Range   Phosphorus 3.7 2.5 - 4.6 mg/dL    Comment: Performed at [facility], [address]  [place], [place] [address]  Hemoglobin A1c     Status: Abnormal   Collection Time: 10/19/21  4:52 AM  Result Value Ref Range   Hgb A1c MFr Bld 6.1 (H) 4.8 - 5.6 %    Comment: (NOTE) Pre diabetes:          5.7%-6.4%  Diabetes:              >6.4%  Glycemic control for   <7.0% adults with diabetes    Mean Plasma Glucose 128.37 mg/dL    Comment: Performed at [facility], [address] [place], [place] [address]   DG Chest 2 View  Result Date: 10/18/2021 CLINICAL DATA:  Chest pain EXAM: CHEST - 2 VIEW COMPARISON:  02/02/2021 FINDINGS: Cardiac and mediastinal contours are within normal limits. No focal pulmonary opacity. No pleural effusion or pneumothorax. No acute osseous abnormality. IMPRESSION: No acute cardiopulmonary process. Electronically Signed   By: Wiliam KeAlison  Vasan M.D.   On: 10/18/2021 12:19   CT Angio Chest PE W and/or Wo Contrast  Result Date: 10/18/2021 CLINICAL DATA:  Chest pain and vomiting since 3 a.m. EXAM: CT ANGIOGRAPHY CHEST WITH CONTRAST TECHNIQUE: Multidetector CT imaging of the chest was performed using the standard protocol during bolus administration of intravenous contrast. Multiplanar CT image reconstructions and MIPs were obtained to evaluate the vascular anatomy. RADIATION DOSE REDUCTION: This exam was performed according to the departmental dose-optimization program which includes automated exposure control, adjustment of the mA and/or kV according to patient size and/or use of iterative reconstruction technique. CONTRAST:  100mL OMNIPAQUE IOHEXOL 350 MG/ML SOLN COMPARISON:  10/18/2021 FINDINGS: Cardiovascular: This is a technically adequate evaluation of the pulmonary vasculature. No filling defects or pulmonary emboli. The heart is unremarkable without pericardial effusion. No evidence of thoracic aortic aneurysm or dissection. Mediastinum/Nodes: No enlarged mediastinal, hilar, or axillary lymph nodes. Thyroid gland, trachea, and esophagus demonstrate no significant  findings. Small hiatal hernia. Lungs/Pleura: No airspace disease, effusion, or pneumothorax. Minimal hypoventilatory changes at the right base. Central airways are patent. Upper Abdomen: Diffuse hepatic steatosis. No acute upper abdominal finding. Musculoskeletal: No acute or destructive bony lesions. Reconstructed images demonstrate no additional findings. Review of the MIP images confirms the above findings. IMPRESSION: 1. No evidence of pulmonary embolus. 2. No acute intrathoracic process. 3. Small hiatal hernia. 4. Hepatic steatosis. Electronically Signed   By: Sharlet SalinaMichael  Brown M.D.   On: 10/18/2021 17:01   DG Foot Complete Right  Result Date: 10/18/2021 CLINICAL DATA:  Foot injury. Longstanding wound in the fifth toe with increasing pain. EXAM: RIGHT FOOT COMPLETE - 3+ VIEW COMPARISON:  None. FINDINGS: Decreased Bohler angle may reflect a remote calcaneal injury. No definite acute fracture, dislocation, or destructive osseous lesion is identified. The soft tissues are unremarkable. IMPRESSION: No acute osseous abnormality identified. Electronically Signed   By: Sebastian AcheAllen  Grady M.D.   On: 10/18/2021 13:13    Pending Labs Unresulted Labs (From admission, onward)     Start     Ordered   10/19/21 0500  HIV Antibody (routine testing w rflx)  (HIV Antibody (Routine testing w reflex) panel)  Tomorrow morning,   R        10/18/21 1812   10/19/21 0500  TSH  Tomorrow morning,   R        10/18/21 1812            Vitals/Pain Today's Vitals   10/19/21 0800 10/19/21 1011 10/19/21 1013 10/19/21 1015  BP: 111/64 (!) 143/98  (!) 143/98  Pulse:  92  95  Resp:  20    Temp:  99.2 F (37.3 C)    TempSrc:  Oral    SpO2:  99%  91%  PainSc:   0-No pain     Isolation Precautions Droplet precaution  Medications Medications  LORazepam (ATIVAN) tablet 1-4 mg ( Oral See Alternative 10/19/21 0624)    Or  LORazepam (ATIVAN) injection 1-4 mg (2 mg Intravenous Given 10/19/21 0624)  folic acid (FOLVITE) tablet  1 mg (1 mg Oral Given 10/19/21 1011)  multivitamin with minerals tablet 1 tablet (1 tablet Oral Given 10/19/21 1011)  dextrose 5 % and 0.9 % NaCl with KCl 20 mEq/L infusion ( Intravenous New Bag/Given 10/19/21 0536)  traMADol (ULTRAM) tablet 50 mg (has no administration in time range)  ondansetron (ZOFRAN) tablet 4 mg (has no administration in time range)    Or  ondansetron (ZOFRAN) injection 4 mg (has no administration in time range)  ibuprofen (ADVIL) tablet 200-400 mg (200 mg Oral Given 10/18/21 2220)  thiamine tablet 100 mg (100 mg Oral Given 10/19/21 1011)  pantoprazole (PROTONIX) injection 40 mg (40 mg Intravenous Given 10/18/21 1903)  metoCLOPramide (REGLAN) injection 5 mg (5 mg Intravenous Given 10/19/21 0614)  cloNIDine (CATAPRES) tablet 0.1 mg (0.1 mg Oral Given 10/19/21 1011)  sodium chloride 0.9 % bolus 1,000 mL (0 mLs Intravenous Stopped 10/18/21 1517)  ondansetron (ZOFRAN) injection 4 mg (4 mg Intravenous Given 10/18/21 1333)  LORazepam (ATIVAN) injection 2 mg (2 mg Intravenous Given 10/18/21 1333)  iohexol (OMNIPAQUE) 350 MG/ML injection 100 mL (100 mLs Intravenous Contrast Given 10/18/21 1642)  thiamine (B-1) injection 100 mg (100 mg Intravenous Given 10/18/21 1903)    Mobility walks

## 2021-10-19 NOTE — ED Notes (Signed)
Patient is Covid +. Notified J. Olena Heckle, NP

## 2021-10-19 NOTE — Progress Notes (Signed)
Jay Gross  CBS:[id number] DOB: July 14, 1971 DOA: 10/18/2021 PCP: Patient, No Pcp Per (Inactive)    Brief Narrative:  51yo with no signif chronic medical history who presented to the ED with a 2-3 day history of intractable vomiting to the point that he was not able to tolerate oral intake of anything other than small sips of water.  This was associated with some epigastric pain.  The patient stated he suffers 2-3 episodes of this each year and thus far has not been able to figure out why.   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   Consultants:  None  Code Status: FULL CODE  Antimicrobials:  None    DVT prophylaxis: SCDs  Interim Hx: The patient has tested COVID-positive since his admission.  Overall however he feels better.  He still having some nausea but no vomiting.  He began experiencing diarrhea today.  He denies chest pain or shortness of breath.  He is more calm and conversant.  He is alert and oriented.  Assessment & Plan:  Intractable nausea and vomiting Possibly related to COVID, though patient reports recurring history of this - alcoholic gastritis or gastroparesis also a possibility -treat symptoms and follow clinically  COVID positive Vomiting could certainly be a GI manifestation of the patient's COVID, but as noted he reports a chronic history of this intermittently -it is likely that this simply represents an incidentally noted positive test - CTa performed in the ED without evidence of parenchymal pulmonary disease  Possible acute alcohol withdrawal without delirium Much improved today with only minimal doses of Ativan required -monitor clinically  Sinus tachycardia Resolved with volume resuscitation, clonidine, and Ativan per CIWA   Small hiatal hernia  Transaminitis AST elevated to a greater extent than ALT consistent with alcohol induced liver damage -LFTs currently climbing -monitor trend -no evidence of  pancreatitis  Tobacco abuse Counseled on absolute need to discontinue tobacco use  Family Communication: No family present at time of exam Disposition: From home -anticipate discharge home, possibly 1/20  Objective: Blood pressure 111/64, pulse (!) 102, temperature 99.3 F (37.4 C), temperature source Oral, resp. rate 19, SpO2 100 %. No intake or output data in the 24 hours ending 10/19/21 1007 There were no vitals filed for this visit.  Examination: General: No acute respiratory distress Lungs: Clear to auscultation bilaterally without wheezes or crackles Cardiovascular: Regular rate and rhythm without murmur gallop or rub normal S1 and S2 Abdomen: Nontender, nondistended, soft, bowel sounds positive, no rebound, no ascites, no appreciable mass Extremities: No significant cyanosis, clubbing, or edema bilateral lower extremities  CBC: Recent Labs  Lab 10/18/21 1303 10/19/21 0452  WBC 6.5 6.2  HGB 15.7 14.5  HCT 45.9 42.8  MCV 92.2 93.9  PLT 149* 121*   Basic Metabolic Panel: Recent Labs  Lab 10/18/21 1303 10/19/21 0452  NA 136 137  K 3.8 3.5  CL 104 103  CO2 23 25  GLUCOSE 94 97  BUN 7 10  CREATININE 0.80 0.81  CALCIUM 9.2 8.7*  MG  --  1.9  PHOS  --  3.7   GFR: CrCl cannot be calculated (Unknown ideal weight.).  Liver Function Tests: Recent Labs  Lab 10/18/21 1303 10/19/21 0452  AST 65* 246*  ALT 41 109*  ALKPHOS 79 63  BILITOT 0.7 0.6  PROT 8.5* 7.6  ALBUMIN 4.2 3.6   Recent Labs  Lab 10/18/21 1303  LIPASE 36   HbA1C: Hgb A1c MFr Bld  Date/Time Value Ref Range Status  10/19/2021 04:52 AM 6.1 (H) 4.8 - 5.6 % Final    Comment:    (NOTE) Pre diabetes:          5.7%-6.4%  Diabetes:              >6.4%  Glycemic control for   <7.0% adults with diabetes      Recent Results (from the past 240 hour(s))  Resp Panel by RT-PCR (Flu A&B, Covid) Nasopharyngeal Swab     Status: Abnormal   Collection Time: 10/19/21  1:54 AM   Specimen:  Nasopharyngeal Swab; Nasopharyngeal(NP) swabs in vial transport medium  Result Value Ref Range Status   SARS Coronavirus 2 by RT PCR POSITIVE (A) NEGATIVE Final    Comment: (NOTE) SARS-CoV-2 target nucleic acids are DETECTED.  The SARS-CoV-2 RNA is generally detectable in upper respiratory specimens during the acute phase of infection. Positive results are indicative of the presence of the identified virus, but do not rule out bacterial infection or co-infection with other pathogens not detected by the test. Clinical correlation with patient history and other diagnostic information is necessary to determine patient infection status. The expected result is Negative.  Fact Sheet for Patients: BloggerCourse.com  Fact Sheet for Healthcare Providers: SeriousBroker.it  This test is not yet approved or cleared by the [place] FDA and  has been authorized for detection and/or diagnosis of SARS-CoV-2 by FDA under an Emergency Use Authorization (EUA).  This EUA will remain in effect (meaning this test can be used) for the duration of  the COVID-19 declaration under Section 564(b)(1) of the A ct, 21 U.S.C. section 360bbb-3(b)(1), unless the authorization is terminated or revoked sooner.     Influenza A by PCR NEGATIVE NEGATIVE Final   Influenza B by PCR NEGATIVE NEGATIVE Final    Comment: (NOTE) The Xpert Xpress SARS-CoV-2/FLU/RSV plus assay is intended as an aid in the diagnosis of influenza from Nasopharyngeal swab specimens and should not be used as a sole basis for treatment. Nasal washings and aspirates are unacceptable for Xpert Xpress SARS-CoV-2/FLU/RSV testing.  Fact Sheet for Patients: BloggerCourse.com  Fact Sheet for Healthcare Providers: SeriousBroker.it  This test is not yet approved or cleared by the [place] FDA and has been authorized for detection and/or  diagnosis of SARS-CoV-2 by FDA under an Emergency Use Authorization (EUA). This EUA will remain in effect (meaning this test can be used) for the duration of the COVID-19 declaration under Section 564(b)(1) of the Act, 21 U.S.C. section 360bbb-3(b)(1), unless the authorization is terminated or revoked.  Performed at [facility], [address] [place], [place] [address]      Scheduled Meds:  cloNIDine  0.1 mg Oral TID   folic acid  1 mg Oral Daily   metoCLOPramide (REGLAN) injection  5 mg Intravenous Q8H   multivitamin with minerals  1 tablet Oral Daily   pantoprazole (PROTONIX) IV  40 mg Intravenous q1800   thiamine  100 mg Oral Daily   Continuous Infusions:  dextrose 5 % and 0.9 % NaCl with KCl 20 mEq/L 125 mL/hr at 10/19/21 0536     LOS: 1 day   Lonia Blood, MD [facility] Office  [phone] Pager - Text Page per Loretha Stapler  If 7PM-7AM, please contact night-coverage per Amion 10/19/2021, 10:07 AM

## 2021-10-19 NOTE — ED Notes (Signed)
Pt received lunch tray 

## 2021-10-20 DIAGNOSIS — R7401 Elevation of levels of liver transaminase levels: Secondary | ICD-10-CM

## 2021-10-20 DIAGNOSIS — R1013 Epigastric pain: Secondary | ICD-10-CM

## 2021-10-20 LAB — COMPREHENSIVE METABOLIC PANEL
ALT: 594 U/L — ABNORMAL HIGH (ref 0–44)
ALT: 511 U/L — ABNORMAL HIGH (ref 0–44)
AST: 1760 U/L — ABNORMAL HIGH (ref 15–41)
AST: 839 U/L — ABNORMAL HIGH (ref 15–41)
Albumin: 3.5 g/dL (ref 3.5–5.0)
Albumin: 3.6 g/dL (ref 3.5–5.0)
Alkaline Phosphatase: 68 U/L (ref 38–126)
Alkaline Phosphatase: 75 U/L (ref 38–126)
Anion gap: 7 (ref 5–15)
Anion gap: 8 (ref 5–15)
BUN: 8 mg/dL (ref 6–20)
BUN: 7 mg/dL (ref 6–20)
CO2: 24 mmol/L (ref 22–32)
CO2: 22 mmol/L (ref 22–32)
Calcium: 8.5 mg/dL — ABNORMAL LOW (ref 8.9–10.3)
Calcium: 8.9 mg/dL (ref 8.9–10.3)
Chloride: 103 mmol/L (ref 98–111)
Chloride: 102 mmol/L (ref 98–111)
Creatinine, Ser: 0.71 mg/dL (ref 0.61–1.24)
Creatinine, Ser: 0.73 mg/dL (ref 0.61–1.24)
GFR, Estimated: >60 mL/min (ref >60)
GFR, Estimated: >60 mL/min (ref >60)
Glucose, Bld: 102 mg/dL — ABNORMAL HIGH (ref 70–99)
Glucose, Bld: 128 mg/dL — ABNORMAL HIGH (ref 70–99)
Potassium: 3.5 mmol/L (ref 3.5–5.1)
Potassium: 3.5 mmol/L (ref 3.5–5.1)
Sodium: 134 mmol/L — ABNORMAL LOW (ref 135–145)
Sodium: 132 mmol/L — ABNORMAL LOW (ref 135–145)
Total Bilirubin: 0.8 mg/dL (ref 0.3–1.2)
Total Bilirubin: 0.8 mg/dL (ref 0.3–1.2)
Total Protein: 7.4 g/dL (ref 6.5–8.1)
Total Protein: 7.5 g/dL (ref 6.5–8.1)

## 2021-10-20 LAB — HEPATITIS PANEL, ACUTE
HCV Ab: NONREACTIVE
Hep A IgM: NONREACTIVE
Hep B C IgM: NONREACTIVE
Hepatitis B Surface Ag: NONREACTIVE

## 2021-10-20 LAB — PROTIME-INR
INR: 1 (ref 0.8–1.2)
Prothrombin Time: 13.6 seconds (ref 11.4–15.2)

## 2021-10-20 NOTE — Discharge Summary (Signed)
DISCHARGE SUMMARY  Jay Gross  MR#: [id number]  DOB:[birth year]  Date of Admission: 10/18/2021 Date of Discharge: 10/20/2021  Attending Physician:Anaissa Macfadden Silvestre Gunner, MD  Patient's MHD:[id number], No Pcp Per (Inactive)  Consults: none   Disposition: D/C home at insistence of patient   Date of Positive COVID Test: 10/19/2021  Date Quarantine Ends: 10/25/2021  COVID-19 specific Treatment: None indicated   Follow-up Appts:  Follow-up Information     Primary Care Provider Follow up.   Why: Our Social Work department will attempt to assist you in finding a primary care physician.  You should be seen within 3-5 days for a recheck of your liver function.  If you are not successful in obtaining a primary care provider within this timeframe any local urgent care should be able to provide the care you need.                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 This was associated with some epigastric pain. The patient stated he suffers 2-3 episodes of this each year and thus far has not been able to figure out why.   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 His LFTs were also observed to be on a steady incline, w/ an abrupt increase noted 1/20 AM, tough fortunately they appeared to be improving as of 10/20/2021 PM.   Intractable nausea and vomiting Possibly related to COVID, though patient reports recurring history of this - alcoholic gastritis or gastroparesis also  a possibility - resolved spontaneously w/ pt tolerating diet w/o difficulty at time of d/c home    Worsening Transaminitis no evidence of pancreatitis - pattern not suggestive of stone/obstruction - EtOH hepatitis or possible CoViD induced? - monitored trend - viral hepatitis panel negative -physician wished to continue supportive care with follow-up CMET 10/21/2021 AM, but patient was adamant that he felt back to normal and wanted to go home -a compromise was reached with an agreement to recheck LFTs 10/20/2021 afternoon and if they were trending downward to proceed with discharge -fortunately LFTs were in fact improving on recheck -the importance of avoiding alcohol and Tylenol was stressed to the patient -the importance of having a recheck of his LFTs in 3-7 days was also expressed to the patient -we attempted to assist the patient in obtaining reliable outpatient follow-up prior to his discharge but this was challenging given his desire to leave the hospital ASAP   COVID positive Vomiting could certainly be a GI manifestation of the patient's COVID, but as noted he reports a chronic history of this intermittently - it is likely that this simply represents an incidentally noted positive test - CTa performed in the ED without evidence of parenchymal pulmonary disease -asymptomatic at time of discharge -counseled on appropriateness of 5 days of isolation followed by 5 additional days of strict mask wearing   Possible acute alcohol withdrawal without delirium There was initial concern for active alcohol withdrawal as patient was tremulous, but he required only minimal Ativan initially and abruptly stabilized and therefore it  is felt less likely that he was actually suffering with withdrawal -he is alert and fully oriented at the time of his discharge  Sinus tachycardia Resolved with volume resuscitation, clonidine, and only a few doses of Ativan -likely primarily driven by volume depletion related to  vomiting   Small hiatal hernia Appears to be asymptomatic and therefore will not initiate long-term medical therapy at this time  Tobacco abuse Counseled on absolute need to discontinue tobacco use    Allergies as of 10/20/2021       Reactions   Other Nausea Only   seafood        Medication List     STOP taking these medications    acetaminophen 325 MG tablet Commonly known as: TYLENOL       TAKE these medications    ibuprofen 200 MG tablet Commonly known as: ADVIL Take 400 mg by mouth every 6 (six) hours as needed for mild pain.        Day of Discharge BP (!) 128/99 (BP Location: Left Arm)    Pulse 80    Temp 98 F (36.7 C) (Oral)    Resp 19    Ht 6' (1.829 m)    Wt 87.4 kg    SpO2 98%    BMI 26.13 kg/m   Physical Exam: General: No acute respiratory distress Lungs: Clear to auscultation bilaterally without wheezes or crackles Cardiovascular: Regular rate and rhythm without murmur gallop or rub normal S1 and S2 Abdomen: Nontender, nondistended, soft, bowel sounds positive, no rebound, no ascites, no appreciable mass Extremities: No significant cyanosis, clubbing, or edema bilateral lower extremities  Basic Metabolic Panel: Recent Labs  Lab 10/18/21 1303 10/19/21 0452 10/20/21 0323 10/20/21 1502  NA 136 137 134* 132*  K 3.8 3.5 3.5 3.5  CL 104 103 103 102  CO2 23 25 24 22   GLUCOSE 94 97 102* 128*  BUN 7 10 8 7   CREATININE 0.80 0.81 0.71 0.73  CALCIUM 9.2 8.7* 8.5* 8.9  MG  --  1.9  --   --   PHOS  --  3.7  --   --     Liver Function Tests: Recent Labs  Lab 10/18/21 1303 10/19/21 0452 10/20/21 0323 10/20/21 1502  AST 65* 246* 1,760* 839*  ALT 41 109* 594* 511*  ALKPHOS 79 63 68 75  BILITOT 0.7 0.6 0.8 0.8  PROT 8.5* 7.6 7.4 7.5  ALBUMIN 4.2 3.6 3.5 3.6   Recent Labs  Lab 10/18/21 1303  LIPASE 36   Coags: Recent Labs  Lab 10/20/21 1009  INR 1.0    CBC: Recent Labs  Lab 10/18/21 1303 10/19/21 0452  WBC 6.5 6.2  HGB 15.7  14.5  HCT 45.9 42.8  MCV 92.2 93.9  PLT 149* 121*    Recent Results (from the past 240 hour(s))  Resp Panel by RT-PCR (Flu A&B, Covid) Nasopharyngeal Swab     Status: Abnormal   Collection Time: 10/19/21  1:54 AM   Specimen: Nasopharyngeal Swab; Nasopharyngeal(NP) swabs in vial transport medium  Result Value Ref Range Status   SARS Coronavirus 2 by RT PCR POSITIVE (A) NEGATIVE Final    Comment: (NOTE) SARS-CoV-2 target nucleic acids are DETECTED.  The SARS-CoV-2 RNA is generally detectable in upper respiratory specimens during the acute phase of infection. Positive results are indicative of the presence of the identified virus, but do not rule out bacterial infection or co-infection with other pathogens not detected by the test. Clinical correlation with patient  history and other diagnostic information is necessary to determine patient infection status. The expected result is Negative.  Fact Sheet for Patients: BloggerCourse.com  Fact Sheet for Healthcare Providers: SeriousBroker.it  This test is not yet approved or cleared by the [place] FDA and  has been authorized for detection and/or diagnosis of SARS-CoV-2 by FDA under an Emergency Use Authorization (EUA).  This EUA will remain in effect (meaning this test can be used) for the duration of  the COVID-19 declaration under Section 564(b)(1) of the A ct, 21 U.S.C. section 360bbb-3(b)(1), unless the authorization is terminated or revoked sooner.     Influenza A by PCR NEGATIVE NEGATIVE Final   Influenza B by PCR NEGATIVE NEGATIVE Final    Comment: (NOTE) The Xpert Xpress SARS-CoV-2/FLU/RSV plus assay is intended as an aid in the diagnosis of influenza from Nasopharyngeal swab specimens and should not be used as a sole basis for treatment. Nasal washings and aspirates are unacceptable for Xpert Xpress SARS-CoV-2/FLU/RSV testing.  Fact Sheet for  Patients: BloggerCourse.com  Fact Sheet for Healthcare Providers: SeriousBroker.it  This test is not yet approved or cleared by the [place] FDA and has been authorized for detection and/or diagnosis of SARS-CoV-2 by FDA under an Emergency Use Authorization (EUA). This EUA will remain in effect (meaning this test can be used) for the duration of the COVID-19 declaration under Section 564(b)(1) of the Act, 21 U.S.C. section 360bbb-3(b)(1), unless the authorization is terminated or revoked.  Performed at [facility], [address], [place], [place] [address]      Time spent in discharge (includes decision making & examination of pt): 35 minutes  10/20/2021, 6:34 PM   Lonia Blood, MD [facility] Office  [phone]

## 2021-10-20 NOTE — TOC Initial Note (Signed)
Transition of Care [facility]) - Initial/Assessment Note    Patient Details  Name: Jay Gross MRN: [id number] Date of Birth: [birth year]  Transition of Care [facility]) CM/SW Contact:    Leeroy Cha, RN Phone Number: 10/20/2021, 7:40 AM  Clinical Narrative:                 Domenic Moras w/d  Transition of Care [facility]) Screening Note   Patient Details  Name: Jay Gross Date of Birth: Feb 15, 1971   Transition of [facility]) CM/SW Contact:    Leeroy Cha, RN Phone Number: 10/20/2021, 7:41 AM    Transition of Care Department [facility]) has reviewed patient and no TOC needs have been identified at this time. We will continue to monitor patient advancement through interdisciplinary progression rounds. If new patient transition needs arise, please place a TOC consult.    Expected Discharge Plan: Home/Self Care Barriers to Discharge: Continued Medical Work up   Patient Goals and CMS Choice Patient states their goals for this hospitalization and ongoing recovery are:: to go home CMS Medicare.gov Compare Post Acute Care list provided to:: Patient Choice offered to / list presented to : Patient  Expected Discharge Plan and Services Expected Discharge Plan: Home/Self Care   Discharge Planning Services: CM Consult   Living arrangements for the past 2 months: Single Family Home                                      Prior Living Arrangements/Services Living arrangements for the past 2 months: Single Family Home Lives with:: Self Patient language and need for interpreter reviewed:: Yes Do you feel safe going back to the place where you live?: Yes            Criminal Activity/Legal Involvement Pertinent to Current Situation/Hospitalization: No - Comment as needed  Activities of Daily Living Home Assistive Devices/Equipment: Eyeglasses, Cane (specify quad or straight) ADL Screening (condition at time of admission) Patient's cognitive ability adequate to safely complete daily  activities?: Yes Is the patient deaf or have difficulty hearing?: No Does the patient have difficulty seeing, even when wearing glasses/contacts?: Yes (blurry vision) Does the patient have difficulty concentrating, remembering, or making decisions?: No Patient able to express need for assistance with ADLs?: Yes Does the patient have difficulty dressing or bathing?: No Independently performs ADLs?: Yes (appropriate for developmental age) Does the patient have difficulty walking or climbing stairs?: Yes Weakness of Legs: Both Weakness of Arms/Hands: Both  Permission Sought/Granted                  Emotional Assessment Appearance:: Appears stated age Attitude/Demeanor/Rapport: Engaged Affect (typically observed): Calm Orientation: : Oriented to Place, Oriented to Self, Oriented to  Time, Oriented to Situation Alcohol / Substance Use: Alcohol Use, Tobacco Use Psych Involvement: No (comment)  Admission diagnosis:  Alcohol withdrawal ([place]) [F10.939] Nonspecific chest pain [R07.9] Alcohol withdrawal syndrome without complication ([place]) 0000000 Patient Active Problem List   Diagnosis Date Noted   Alcohol withdrawal ([place]) 10/18/2021   Intractable vomiting 10/18/2021   Dehydration, moderate 10/18/2021   Tobacco abuse 10/18/2021   Hiatal hernia 10/18/2021   Epigastric pain 10/18/2021   PCP:  Patient, No Pcp Per (Inactive) Pharmacy:   [id number] [address] [place], [place] [address] [place] [address] Phone: [phone] Fax: [phone]     Social Determinants of Health (SDOH) Interventions  Readmission Risk Interventions No flowsheet data found.

## 2021-10-20 NOTE — Plan of Care (Signed)
°  Problem: Education: Goal: Knowledge of General Education information will improve Description: Including pain rating scale, medication(s)/side effects and non-pharmacologic comfort measures Outcome: Progressing   Problem: Education: Goal: Knowledge of risk factors and measures for prevention of condition will improve Outcome: Progressing   Problem: Respiratory: Goal: Will maintain a patent airway Outcome: Progressing

## 2021-10-20 NOTE — Discharge Instructions (Addendum)
DO NOT TAKE TYLENOL, ACETAMINOPHEN, OR ANY PRODUCTS CONTAINING EITHER OF THESE MEDICATIONS. DO NOT DRINK ALCOHOL. ALL OF THESE THINGS CAN MAKE YOUR INFLAMED LIVER WORSE, AND COULD LEAD TO COMPLETE FAILURE OF YOUR LIVER.   You need to have your liver function / liver enzyme levels tested again in 3-5 days. The social worker will help you identify a clinic to see you, but if you are not able to find a clinic any local Urgent Care should be able to provide this service. This is very important to assure your liver is improving.   You should isolate from others for 5 days total, beginning on 10/19/2021 and lasting until 10/25/2021, at which time you can be around others as long as you wear a mask for at least another 5 days. This is to prevent you from spreading CoViD to others.

## 2021-10-20 NOTE — Progress Notes (Signed)
Nutrition Brief Note  Patient identified on the Malnutrition Screening Tool (MST) Report.  Wt Readings from Last 15 Encounters:  10/19/21 87.4 kg  02/02/21 85.1 kg  03/27/20 81.6 kg   Spoke with pt at bedside. Pt reports being very eager to go home and reports that his appetite has been good lately. He reports liking the food here very much.  He denies any recent weight changes, and his partner endorses this as well.  On exam, pt with no depletions. Healthy muscle tone.  Body mass index is 26.13 kg/m.   Current diet order is regular, patient is consuming approximately 100% of meals at this time. Labs and medications reviewed.   No nutrition interventions warranted at this time. If nutrition issues arise, please consult RD.   Vertell Limber, RD, LDN (she/her/hers) Clinical Inpatient Dietitian RD Pager/After-Hours/Weekend Pager # in [place]

## 2021-10-20 NOTE — Plan of Care (Signed)
°  Problem: Education: Goal: Knowledge of General Education information will improve Description: Including pain rating scale, medication(s)/side effects and non-pharmacologic comfort measures 10/20/2021 1710 by Mariann Laster, RN Outcome: Adequate for Discharge 10/20/2021 1319 by Mariann Laster, RN Outcome: Progressing   Problem: Safety: Goal: Ability to remain free from injury will improve Outcome: Adequate for Discharge   Problem: Education: Goal: Knowledge of risk factors and measures for prevention of condition will improve 10/20/2021 1710 by Mariann Laster, RN Outcome: Adequate for Discharge 10/20/2021 1319 by Mariann Laster, RN Outcome: Progressing   Problem: Coping: Goal: Psychosocial and spiritual needs will be supported Outcome: Adequate for Discharge   Problem: Respiratory: Goal: Will maintain a patent airway 10/20/2021 1710 by Mariann Laster, RN Outcome: Adequate for Discharge 10/20/2021 1319 by Mariann Laster, RN Outcome: Progressing Goal: Complications related to the disease process, condition or treatment will be avoided or minimized Outcome: Adequate for Discharge

## 2023-07-21 IMAGING — CT CT ANGIO CHEST
2 of 6 series · 18 of 36 positions shown · IV contrast (agent unspecified)
Comparison: 10/18/2021

CLINICAL DATA: Chest pain and vomiting since 3 a.m.

EXAM:
CT ANGIOGRAPHY CHEST WITH CONTRAST
TECHNIQUE: Multidetector CT imaging of the chest was performed using the
standard protocol during bolus administration of intravenous
contrast. Multiplanar CT image reconstructions and MIPs were
obtained to evaluate the vascular anatomy.

[Series 5: thins · axial · 0.77mm/px · z∈[-280,+4]mm · 17 of 320 slices shown]
[im 18/320  lung]
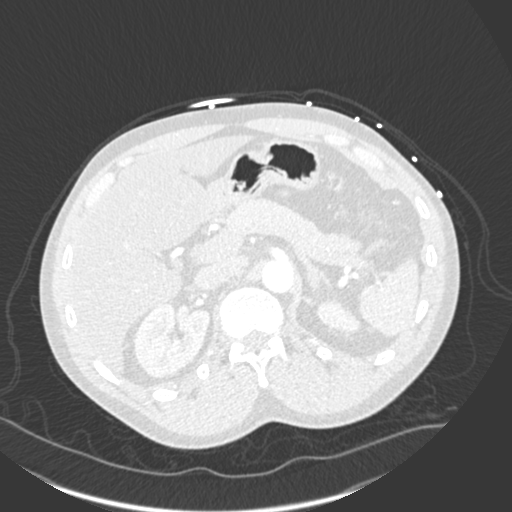
[im 36/320  mediastinal]
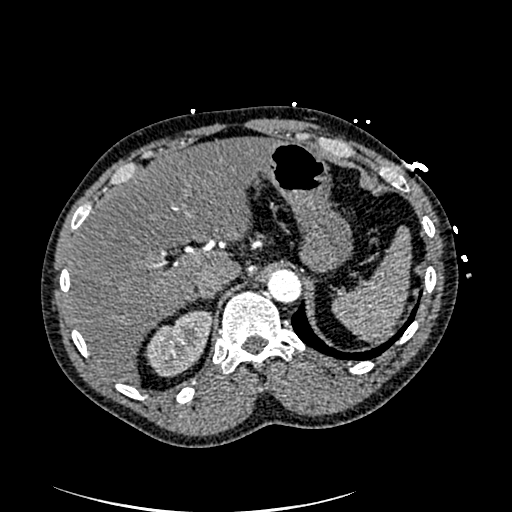
[im 54/320  lung]
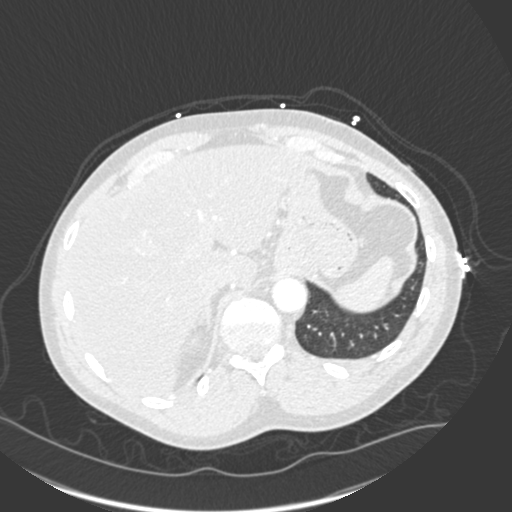
[im 71/320  mediastinal]
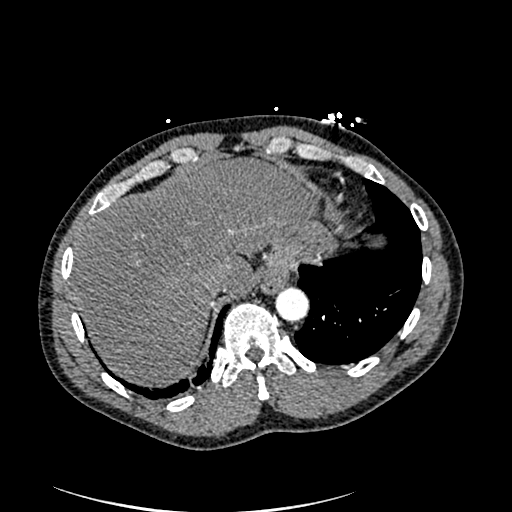
[im 89/320  lung]
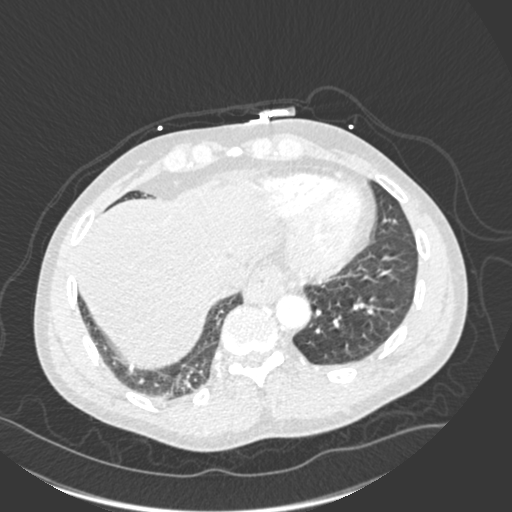
[im 107/320  mediastinal]
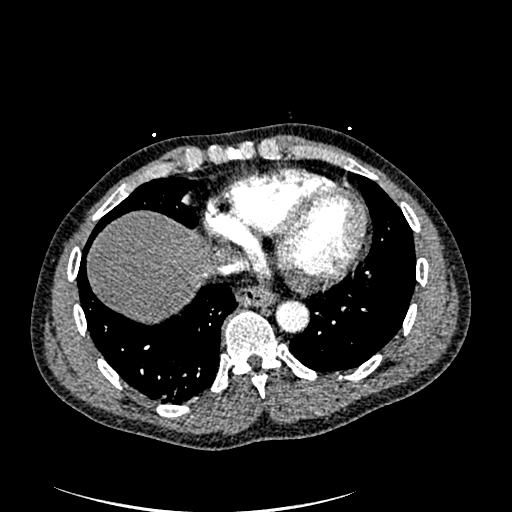
[im 125/320  lung]
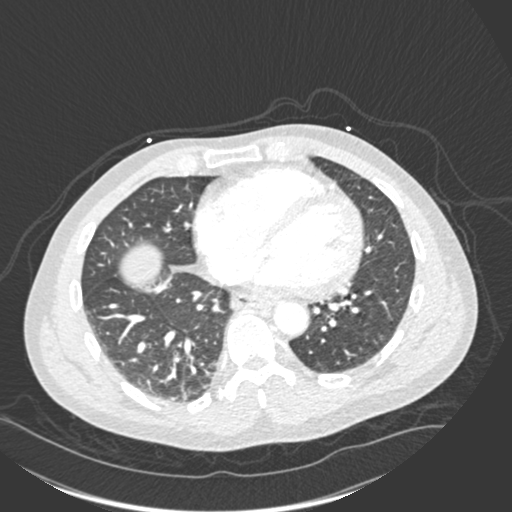
[im 142/320  mediastinal]
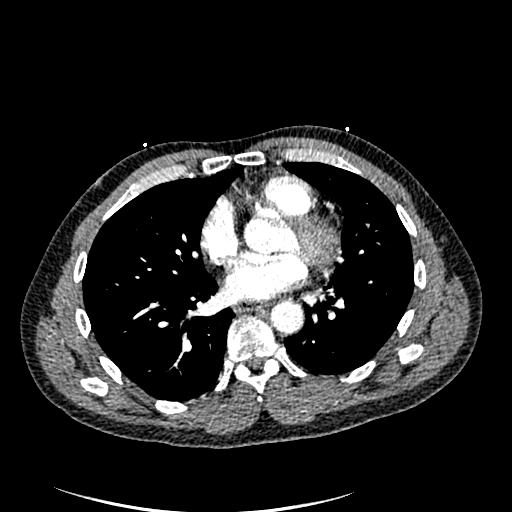
[im 160/320  lung]
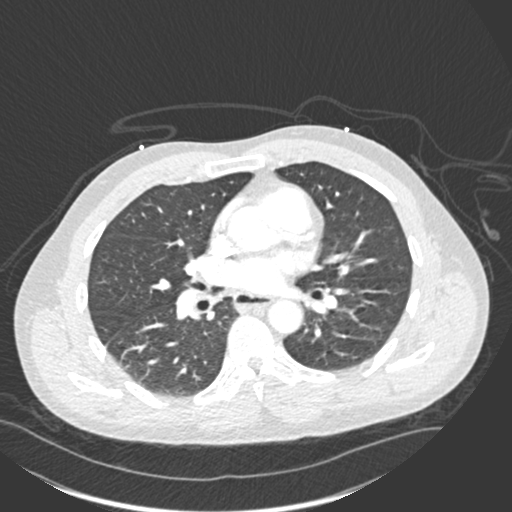
[im 178/320  mediastinal]
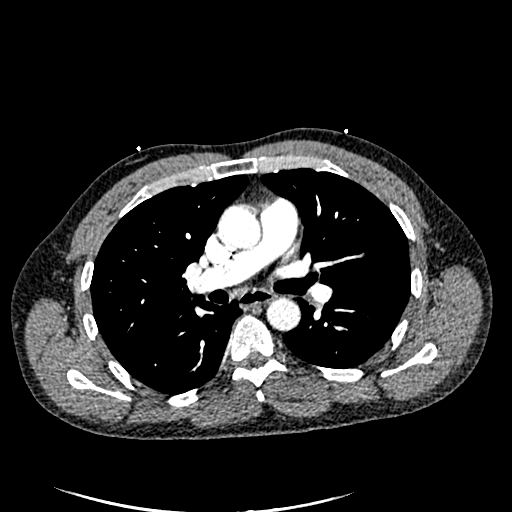
[im 195/320  lung]
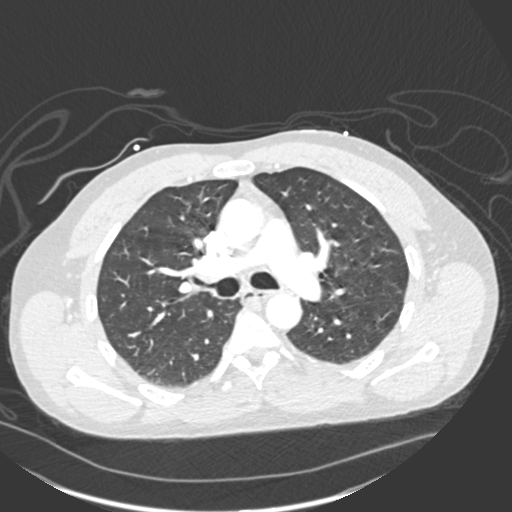
[im 213/320  mediastinal]
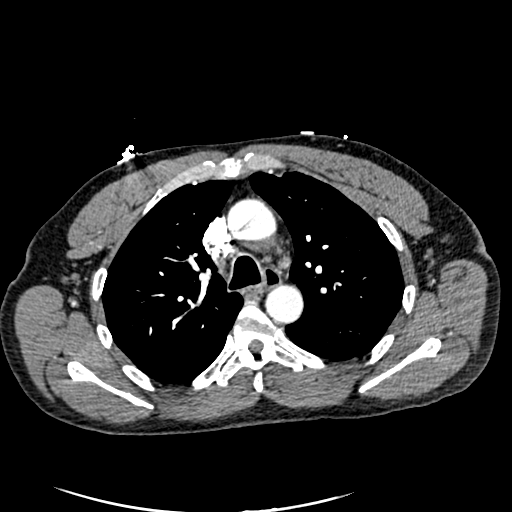
[im 231/320  lung]
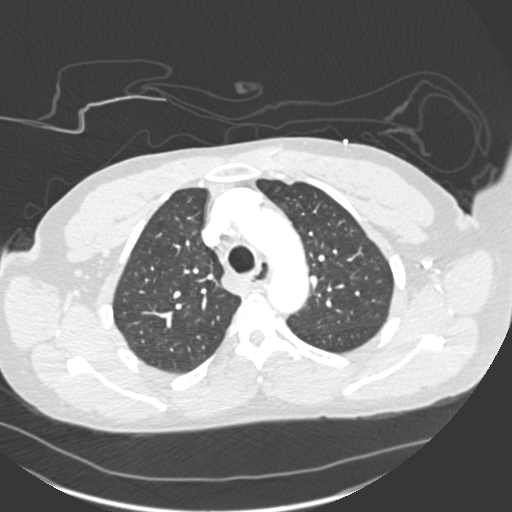
[im 249/320  mediastinal]
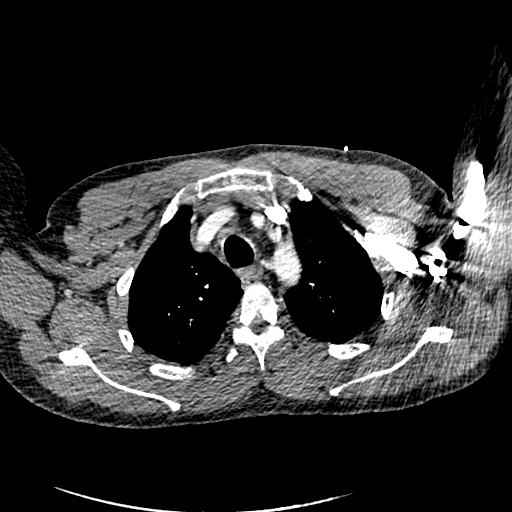
[im 266/320  lung]
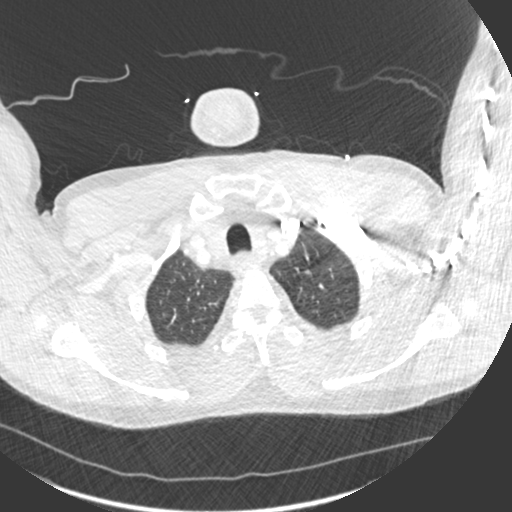
[im 284/320  mediastinal]
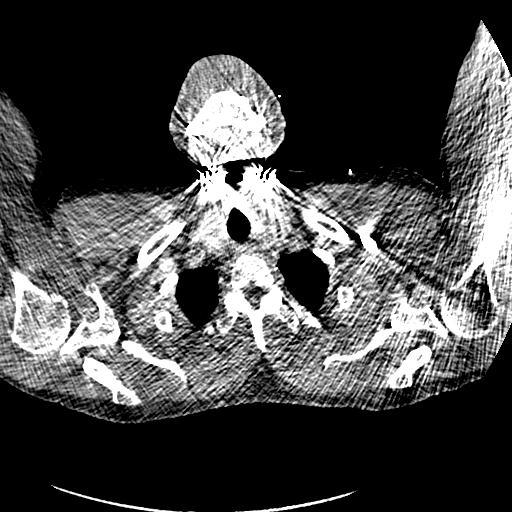
[im 302/320  lung]
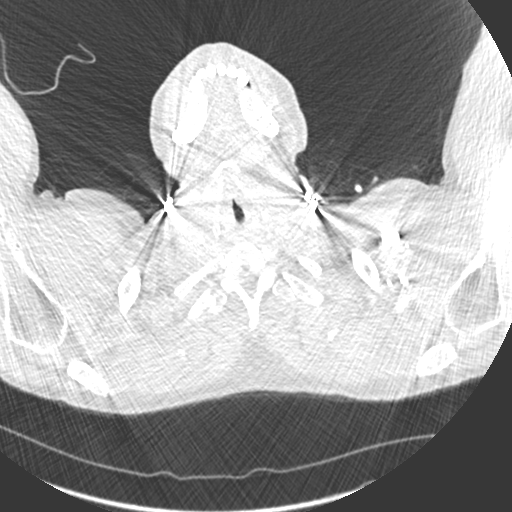

[Series 6: coronal mpr · coronal · 0.67mm/px · 1 of 125 slices shown]
[im 63/125  mediastinal]
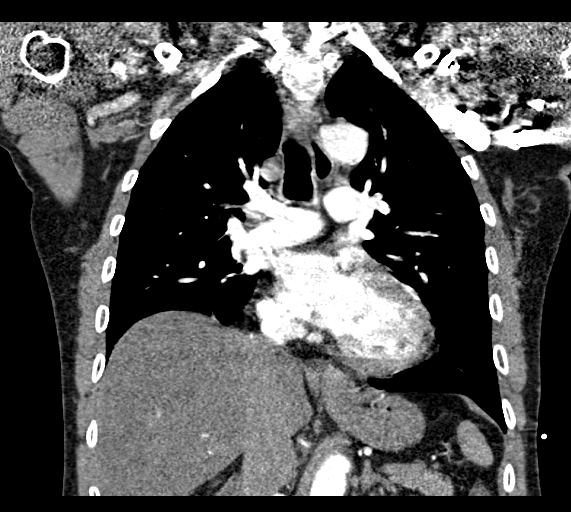

[18 of 36 positions shown; findings below may reference images not displayed]

RADIATION DOSE REDUCTION: This exam was performed according to the
departmental dose-optimization program which includes automated
exposure control, adjustment of the mA and/or kV according to
patient size and/or use of iterative reconstruction technique.

CONTRAST:  100mL OMNIPAQUE IOHEXOL 350 MG/ML SOLN
FINDINGS: Cardiovascular: This is a technically adequate evaluation of the
pulmonary vasculature. No filling defects or pulmonary emboli.

The heart is unremarkable without pericardial effusion. No evidence
of thoracic aortic aneurysm or dissection.

Mediastinum/Nodes: No enlarged mediastinal, hilar, or axillary lymph
nodes. Thyroid gland, trachea, and esophagus demonstrate no
significant findings. Small hiatal hernia.

Lungs/Pleura: No airspace disease, effusion, or pneumothorax.
Minimal hypoventilatory changes at the right base. Central airways
are patent.

Upper Abdomen: Diffuse hepatic steatosis. No acute upper abdominal
finding.

Musculoskeletal: No acute or destructive bony lesions. Reconstructed
images demonstrate no additional findings.

Review of the MIP images confirms the above findings.
IMPRESSION: 1. No evidence of pulmonary embolus.
2. No acute intrathoracic process.
3. Small hiatal hernia.
4. Hepatic steatosis.
